# Patient Record
Sex: Female | Born: 1955 | Race: White | Hispanic: No | Marital: Married | Smoking: Never smoker
Health system: Southern US, Community
[De-identification: ages and names within clinical notes are randomized; demographics above are authoritative.]

## PROBLEM LIST (undated history)

## (undated) DIAGNOSIS — E079 Disorder of thyroid, unspecified: Secondary | ICD-10-CM

## (undated) DIAGNOSIS — F329 Major depressive disorder, single episode, unspecified: Secondary | ICD-10-CM

## (undated) DIAGNOSIS — F32A Depression, unspecified: Secondary | ICD-10-CM

## (undated) DIAGNOSIS — F419 Anxiety disorder, unspecified: Secondary | ICD-10-CM

## (undated) DIAGNOSIS — E7800 Pure hypercholesterolemia, unspecified: Secondary | ICD-10-CM

## (undated) DIAGNOSIS — E119 Type 2 diabetes mellitus without complications: Secondary | ICD-10-CM

## (undated) HISTORY — PX: ABDOMINAL HYSTERECTOMY: SHX81

## (undated) HISTORY — PX: OTHER SURGICAL HISTORY: SHX169

## (undated) HISTORY — PX: TUBAL LIGATION: SHX77

---

## 2011-07-22 ENCOUNTER — Emergency Department: Payer: Self-pay | Admitting: *Deleted

## 2011-08-01 ENCOUNTER — Ambulatory Visit: Payer: Self-pay | Admitting: Gastroenterology

## 2011-08-30 ENCOUNTER — Ambulatory Visit: Payer: Self-pay | Admitting: Gastroenterology

## 2012-02-16 ENCOUNTER — Ambulatory Visit: Payer: Self-pay

## 2012-02-16 LAB — URINALYSIS, COMPLETE
Glucose,UR: NEGATIVE mg/dL (ref 0–75)
Ketone: NEGATIVE
Nitrite: POSITIVE
Ph: 7 (ref 4.5–8.0)
Specific Gravity: 1.015 (ref 1.003–1.030)

## 2012-02-18 LAB — URINE CULTURE

## 2012-08-21 ENCOUNTER — Ambulatory Visit: Payer: Self-pay | Admitting: Family Medicine

## 2014-02-10 ENCOUNTER — Ambulatory Visit: Payer: Self-pay | Admitting: Family Medicine

## 2014-02-11 ENCOUNTER — Ambulatory Visit: Payer: Self-pay | Admitting: Family Medicine

## 2014-03-14 ENCOUNTER — Ambulatory Visit: Payer: Self-pay | Admitting: Family Medicine

## 2014-04-14 ENCOUNTER — Ambulatory Visit: Payer: Self-pay | Admitting: Family Medicine

## 2015-02-03 ENCOUNTER — Other Ambulatory Visit: Payer: Self-pay | Admitting: Family Medicine

## 2015-02-03 NOTE — Telephone Encounter (Signed)
Routing to provider  

## 2015-03-10 ENCOUNTER — Other Ambulatory Visit: Payer: Self-pay | Admitting: Family Medicine

## 2015-03-10 NOTE — Telephone Encounter (Signed)
Left message for patient to call.

## 2015-03-10 NOTE — Telephone Encounter (Signed)
Patient notified

## 2015-03-10 NOTE — Telephone Encounter (Signed)
Routing to provider  

## 2015-03-10 NOTE — Telephone Encounter (Signed)
I reviewed my last note and documented that patient's endocrinologist is managing her diabetes She should contact her endocrinologist from now on for these refills; thanks

## 2015-04-26 ENCOUNTER — Other Ambulatory Visit: Payer: Self-pay | Admitting: Family Medicine

## 2015-04-26 DIAGNOSIS — Z1231 Encounter for screening mammogram for malignant neoplasm of breast: Secondary | ICD-10-CM

## 2015-05-04 ENCOUNTER — Ambulatory Visit
Admission: RE | Admit: 2015-05-04 | Discharge: 2015-05-04 | Disposition: A | Discharge status: Home / Self Care | Payer: BLUE CROSS/BLUE SHIELD | Source: Ambulatory Visit | Attending: Family Medicine | Admitting: Family Medicine

## 2015-05-04 DIAGNOSIS — Z1231 Encounter for screening mammogram for malignant neoplasm of breast: Secondary | ICD-10-CM | POA: Diagnosis not present

## 2015-06-08 ENCOUNTER — Telehealth: Payer: Self-pay | Admitting: Family Medicine

## 2015-06-08 NOTE — Telephone Encounter (Signed)
January labs reviewed Please let Elliot GaultPhyllis A Dopson know that I'd like to see patient for an appointment here in the office for:   Please schedule a visit with me in late November Fasting?  Yes please (last fasting labs were January) Thank you, Dr. Sherie DonLada I'm sending refill as requested; not sure why it's in med list twice

## 2015-06-08 NOTE — Telephone Encounter (Signed)
Routing to provider  

## 2015-06-10 NOTE — Telephone Encounter (Signed)
Talked with patient she [place] and will call back Monday to schedule a f/u appointment with fasting labs.

## 2015-06-17 NOTE — Telephone Encounter (Signed)
Left 3 vm on patient phone to call us back and schedule a f/u.

## 2015-06-30 ENCOUNTER — Encounter: Payer: Self-pay | Admitting: Family Medicine

## 2015-08-02 ENCOUNTER — Other Ambulatory Visit: Payer: Self-pay | Admitting: Family Medicine

## 2015-08-02 NOTE — Telephone Encounter (Signed)
Please see October phone note Patient still needs a follow-up appt; I appreciate you sending the letter but she apparently still hasn't responded; do you mind calling again? Thanks

## 2015-08-02 NOTE — Telephone Encounter (Signed)
Routing to provider  

## 2015-08-03 NOTE — Telephone Encounter (Signed)
Left vm on patient chart to call us back.

## 2015-08-13 ENCOUNTER — Encounter: Payer: Self-pay | Admitting: Family Medicine

## 2015-08-28 ENCOUNTER — Ambulatory Visit
Admission: EM | Admit: 2015-08-28 | Discharge: 2015-08-28 | Disposition: A | Discharge status: Home / Self Care | Payer: BLUE CROSS/BLUE SHIELD | Attending: Family Medicine | Admitting: Family Medicine

## 2015-08-28 ENCOUNTER — Encounter: Payer: Self-pay | Admitting: Gynecology

## 2015-08-28 DIAGNOSIS — J40 Bronchitis, not specified as acute or chronic: Secondary | ICD-10-CM

## 2015-08-28 DIAGNOSIS — J0100 Acute maxillary sinusitis, unspecified: Secondary | ICD-10-CM | POA: Diagnosis not present

## 2015-08-28 DIAGNOSIS — J209 Acute bronchitis, unspecified: Secondary | ICD-10-CM

## 2015-08-28 HISTORY — DX: Depression, unspecified: F32.A

## 2015-08-28 HISTORY — DX: Disorder of thyroid, unspecified: E07.9

## 2015-08-28 HISTORY — DX: Type 2 diabetes mellitus without complications: E11.9

## 2015-08-28 HISTORY — DX: Pure hypercholesterolemia, unspecified: E78.00

## 2015-08-28 HISTORY — DX: Major depressive disorder, single episode, unspecified: F32.9

## 2015-08-28 HISTORY — DX: Anxiety disorder, unspecified: F41.9

## 2015-08-28 MED ORDER — ALBUTEROL SULFATE HFA 108 (90 BASE) MCG/ACT IN AERS: 2.0000 | INHALATION_SPRAY | RESPIRATORY_TRACT | Status: DC | PRN

## 2015-08-28 MED ORDER — AZITHROMYCIN 250 MG PO TABS: ORAL_TABLET | ORAL | Status: DC

## 2015-08-28 MED ORDER — BENZONATATE 100 MG PO CAPS: 100.0000 mg | ORAL_CAPSULE | Freq: Three times a day (TID) | ORAL | Status: DC | PRN

## 2015-08-28 MED ORDER — GUAIFENESIN-CODEINE 100-10 MG/5ML PO SOLN
10.0000 mL | Freq: Every evening | ORAL | Status: DC | PRN
Start: 2015-08-28 — End: 2015-10-01

## 2015-08-28 NOTE — ED Notes (Signed)
Patient c/o cough / sinus / ear pain/ wheezing x 5 days.

## 2015-08-28 NOTE — ED Provider Notes (Signed)
[facility]  ____________________________________________  Time seen: Approximately 4:11 PM  I have reviewed the triage vital signs and the nursing notes.   HISTORY  Chief Complaint URI   HPI Elliot Gaulthyllis A Fister is a 60 y.o. female presents with a complaint of 5-6 days of cough and congestion. Patient reports initially started out with sore throat, runny nose and nasal congestion but states has continued and states primarily a cough. Patient states continues to have some sinus drainage and some sinus pressure as she states that she can fill aching pressure around her cheeks. Also reports cough and states cough is throughout the day but worse at night. Patient positioning. The drainage at night. States cough is primarily a dry cough.  States current sinus discomfort is 2 out of 10 aching. Denies chest pain, shortness of breath, dizziness, weakness, fevers. Patient reports that she frequently exposed to sick people at work.  PCP: Sherie DonLada   Past Medical History  Diagnosis Date  . Thyroid disease   . Diabetes mellitus without complication (HCC)   . Anxiety   . Depression   . High cholesterol     There are no active problems to display for this patient.   Past Surgical History  Procedure Laterality Date  . Tubal ligation    . Thyriodectomy    . Abdominal hysterectomy      Current Outpatient Rx  Name  Route  Sig  Dispense  Refill  . aspirin 81 MG tablet   Oral   Take 81 mg by mouth daily.         . Cholecalciferol (VITAMIN D-3) 1000 units CAPS   Oral   Take by mouth.         . finasteride (PROSCAR) 5 MG tablet   Oral   Take 5 mg by mouth daily.         . Flaxseed, Linseed, (FLAXSEED OIL PO)   Oral   Take by mouth.         . Lactobacillus (PROBIOTIC ACIDOPHILUS PO)   Oral   Take by mouth.         . spironolactone (ALDACTONE) 100 MG tablet   Oral   Take 100 mg by mouth daily.         Marland Kitchen. thyroid (ARMOUR) 90 MG tablet   Oral   Take 90 mg by  mouth daily.         .           .           .           .               .           .           .           .             Allergies Fish allergy and Shellfish allergy  No family history on file.  Social History Social History  Substance Use Topics  . Smoking status: Never Smoker   . Smokeless tobacco: None  . Alcohol Use: Yes    Review of Systems Constitutional: No fever/chills Eyes: No visual changes. ENT: No sore throat. Cardiovascular: Denies chest pain. Respiratory: Denies shortness of breath. Gastrointestinal: No abdominal pain.  No nausea, no vomiting.  No diarrhea.  No constipation. Genitourinary: Negative for dysuria. Musculoskeletal: Negative for back pain. Skin: Negative for  rash. Neurological: Negative for headaches, focal weakness or numbness.  10-point ROS otherwise negative.  ____________________________________________   PHYSICAL EXAM:  VITAL SIGNS: ED Triage Vitals  Enc Vitals Group     BP 08/28/15 1430 137/79 mmHg     Pulse Rate 08/28/15 1430 69     Resp 08/28/15 1430 16     Temp 08/28/15 1430 97.7 F (36.5 C)     Temp Source 08/28/15 1430 Oral     SpO2 08/28/15 1430 98 %     Weight 08/28/15 1430 170 lb (77.111 kg)     Height 08/28/15 1430 5\' 5"  (1.651 m)     Head Cir --      Peak Flow --      Pain Score 08/28/15 1433 0     Pain Loc --      Pain Edu? --      Excl. in GC? --     Constitutional: Alert and oriented. Well appearing and in no acute distress. Eyes: Conjunctivae are normal. PERRL. EOMI. Head: Atraumatic. Mild tenderness to palpation bilateral maxillary sinuses. No frontal sinus tenderness to palpation. No swelling. No erythema.  Ears: no erythema, normal TMs bilaterally.   Nose: Nasal congestion with bilateral nasal turbinate erythema.  Mouth/Throat: Mucous membranes are moist.  Oropharynx non-erythematous. No tonsillar swelling or exudate. Neck: No stridor.  No cervical spine tenderness to  palpation. Hematological/Lymphatic/Immunilogical: No cervical lymphadenopathy. Cardiovascular: Normal rate, regular rhythm. Grossly normal heart sounds.  Good peripheral circulation. Respiratory: Normal respiratory effort.  No retractions. Lungs CTAB. No wheezes, rales or rhonchi at rest. Speaks in complete sentences. Patient with dry intermittent cough noted in room, mild wheeze bronchospasm noted with cough. Gastrointestinal: Soft and nontender.  Musculoskeletal: No lower or upper extremity tenderness nor edema.  No calf tenderness bilaterally. Bilateral pedal pulses equal and easily palpated.  Neurologic:  Normal speech and language. No gross focal neurologic deficits are appreciated. No gait instability. Skin:  Skin is warm, dry and intact. No rash noted. Psychiatric: Mood and affect are normal. Speech and behavior are normal.  ____________________________________________   LABS (all labs ordered are listed, but only abnormal results are displayed)  Labs Reviewed - No data to display   INITIAL IMPRESSION / ASSESSMENT AND PLAN / ED COURSE  Pertinent labs & imaging results that were available during my care of the patient were reviewed by me and considered in my medical decision making (see chart for details).  Very well-appearing patient. No acute distress. Presents for the complaints of 5-6 days of runny nose, nasal congestion and cough. Lungs clear throughout except for with slight mild bronchospasm wheeze noted with dry intermittent cough in room. Minimal to mild sinus maxillary tenderness to palpation. Very well-appearing patient. Suspect sinusitis and bronchitis with bronchospasm. No areas of focal consolidation auscultated.  Will treat bronchitis and sinusitis with oral azithromycin, when necessary Tessalon Perles during the day, when necessary guaifenesin with codeine at night and when necessary albuterol inhaler as needed. Encouraged rest, fluids and PCP follow up. Encouraged  patient if chest congestion increases, increased wheezing or fevers recommend reevaluation and likely chest x-ray. Patient reports does not want chest x-ray performed at this time.  Discussed follow up with Primary care physician this week. Discussed follow up and return parameters including no resolution or any worsening concerns. Patient verbalized understanding and agreed to plan.   ____________________________________________   FINAL CLINICAL IMPRESSION(S) / ED DIAGNOSES  Final diagnoses:  Bronchitis with bronchospasm  Acute maxillary sinusitis, recurrence not specified  Renford Dills, NP 08/28/15 [phone]

## 2015-08-28 NOTE — Discharge Instructions (Signed)
Take medication as prescribed. Rest.   Follow up with your primary care physician this week as needed. Return to Urgent care or go to ER for chest pain, shortness of breath, fevers, new or worsening concerns.   Sinusitis, Adult Sinusitis is redness, soreness, and puffiness (inflammation) of the air pockets in the bones of your face (sinuses). The redness, soreness, and puffiness can cause air and mucus to get trapped in your sinuses. This can allow germs to grow and cause an infection.  HOME CARE  1. Drink enough fluids to keep your pee (urine) clear or pale yellow. 2. Use a humidifier in your home. 3. Run a hot shower to create steam in the bathroom. Sit in the bathroom with the door closed. Breathe in the steam 3-4 times a day. 4. Put a warm, moist washcloth on your face 3-4 times a day, or as told by your doctor. 5. Use salt water sprays (saline sprays) to wet the thick fluid in your nose. This can help the sinuses drain. 6. Only take medicine as told by your doctor. GET HELP RIGHT AWAY IF:   Your pain gets worse.  You have very bad headaches.  You are sick to your stomach (nauseous).  You throw up (vomit).  You are very sleepy (drowsy) all the time.  Your face is puffy (swollen).  Your vision changes.  You have a stiff neck.  You have trouble breathing. MAKE SURE YOU:   Understand these instructions.  Will watch your condition.  Will get help right away if you are not doing well or get worse.   This information is not intended to replace advice given to you by your health care provider. Make sure you discuss any questions you have with your health care provider.   Document Released: 01/17/2008 Document Revised: 08/21/2014 Document Reviewed: 03/05/2012 Elsevier Interactive Patient Education 2016 [employer].  How to Use an Inhaler Using your inhaler correctly is very important. Good technique will make sure that the medicine reaches your lungs.  HOW TO USE AN  INHALER: 7. Take the cap off the inhaler. 8. If this is the first time using your inhaler, you need to prime it. Shake the inhaler for 5 seconds. Release four puffs into the air, away from your face. Ask your doctor for help if you have questions. 9. Shake the inhaler for 5 seconds. 10. Turn the inhaler so the bottle is above the mouthpiece. 11. Put your pointer finger on top of the bottle. Your thumb holds the bottom of the inhaler. 12. Open your mouth. 13. Either hold the inhaler away from your mouth (the width of 2 fingers) or place your lips tightly around the mouthpiece. Ask your doctor which way to use your inhaler. 14. Breathe out as much air as possible. 15. Breathe in and push down on the bottle 1 time to release the medicine. You will feel the medicine go in your mouth and throat. 16. Continue to take a deep breath in very slowly. Try to fill your lungs. 17. After you have breathed in completely, hold your breath for 10 seconds. This will help the medicine to settle in your lungs. If you cannot hold your breath for 10 seconds, hold it for as long as you can before you breathe out. 18. Breathe out slowly, through pursed lips. Whistling is an example of pursed lips. 19. If your doctor has told you to take more than 1 puff, wait at least 15-30 seconds between puffs. This will  help you get the best results from your medicine. Do not use the inhaler more than your doctor tells you to. 20. Put the cap back on the inhaler. 21. Follow the directions from your doctor or from the inhaler package about cleaning the inhaler. If you use more than one inhaler, ask your doctor which inhalers to use and what order to use them in. Ask your doctor to help you figure out when you will need to refill your inhaler.  If you use a steroid inhaler, always rinse your mouth with water after your last puff, gargle and spit out the water. Do not swallow the water. GET HELP IF:  The inhaler medicine only partially  helps to stop wheezing or shortness of breath.  You are having trouble using your inhaler.  You have some increase in thick spit (phlegm). GET HELP RIGHT AWAY IF:  The inhaler medicine does not help your wheezing or shortness of breath or you have tightness in your chest.  You have dizziness, headaches, or fast heart rate.  You have chills, fever, or night sweats.  You have a large increase of thick spit, or your thick spit is bloody. MAKE SURE YOU:   Understand these instructions.  Will watch your condition.  Will get help right away if you are not doing well or get worse.   This information is not intended to replace advice given to you by your health care provider. Make sure you discuss any questions you have with your health care provider.   Document Released: 05/09/2008 Document Revised: 05/21/2013 Document Reviewed: 02/27/2013 Elsevier Interactive Patient Education [employer]2016 Elsevier Inc.

## 2015-09-06 ENCOUNTER — Other Ambulatory Visit: Payer: Self-pay | Admitting: Family Medicine

## 2015-09-07 NOTE — Telephone Encounter (Signed)
We have tried to call her and sent two letters; Rx denied; she needs an appt and labs

## 2015-10-01 ENCOUNTER — Encounter: Payer: Self-pay | Admitting: Family Medicine

## 2015-10-01 ENCOUNTER — Ambulatory Visit (INDEPENDENT_AMBULATORY_CARE_PROVIDER_SITE_OTHER): Payer: BLUE CROSS/BLUE SHIELD | Admitting: Family Medicine

## 2015-10-01 VITALS — BP 129/82 | HR 71 | Temp 98.6°F | Ht 64.7 in | Wt 173.0 lb

## 2015-10-01 DIAGNOSIS — E7800 Pure hypercholesterolemia, unspecified: Secondary | ICD-10-CM | POA: Diagnosis not present

## 2015-10-01 DIAGNOSIS — Z23 Encounter for immunization: Secondary | ICD-10-CM

## 2015-10-01 DIAGNOSIS — Z1159 Encounter for screening for other viral diseases: Secondary | ICD-10-CM | POA: Insufficient documentation

## 2015-10-01 DIAGNOSIS — Z114 Encounter for screening for human immunodeficiency virus [HIV]: Secondary | ICD-10-CM | POA: Diagnosis not present

## 2015-10-01 DIAGNOSIS — E890 Postprocedural hypothyroidism: Secondary | ICD-10-CM | POA: Diagnosis not present

## 2015-10-01 DIAGNOSIS — Z5181 Encounter for therapeutic drug level monitoring: Secondary | ICD-10-CM

## 2015-10-01 DIAGNOSIS — E119 Type 2 diabetes mellitus without complications: Secondary | ICD-10-CM

## 2015-10-01 DIAGNOSIS — B351 Tinea unguium: Secondary | ICD-10-CM | POA: Diagnosis not present

## 2015-10-01 MED ORDER — SIMVASTATIN 20 MG PO TABS: 20.0000 mg | ORAL_TABLET | Freq: Every day | ORAL | Status: DC

## 2015-10-01 NOTE — Patient Instructions (Addendum)
You received the flu shot today; it should protect you against the flu virus over the coming months; it will take about two weeks for antibodies to develop; do try to stay away from hospitals, nursing homes, and daycares during peak flu season; taking extra vitamin C daily during flu season may help you avoid getting sick  Try Vicks vapor rub or dilute vinegar on the toenails every day for several months to see if that takes care of your nail condition  Your goal blood pressure is less than 130 mmHg on top (I said 140 during our visit, but it's actually under 130 for you) Try to follow the DASH guidelines (DASH stands for Dietary Approaches to Stop Hypertension) Try to limit the sodium in your diet.  Ideally, consume less than 1.5 grams (less than 1,500mg ) per day. Do not add salt when cooking or at the table.  Check the sodium amount on labels when shopping, and choose items lower in sodium when given a choice. Avoid or limit foods that already contain a lot of sodium. Eat a diet rich in fruits and vegetables and whole grains.

## 2015-10-01 NOTE — Progress Notes (Signed)
BP 129/82 mmHg  Pulse 71  Temp(Src) 98.6 F (37 C)  Ht 5' 4.7" (1.643 m)  Wt 173 lb (78.472 kg)  BMI 29.07 kg/m2  SpO2 98%   Subjective:    Patient ID: Susan Bates, female    DOB: [birth year], 61 y.o.   MRN: [id number]  HPI: Susan Bates is a 60 y.o. female  Chief Complaint  Patient presents with  . i dont remember   She had bronchitis a month ago; cough and trouble breathing; went to urgent care; gave her inhaler, used a couple of times, all better now  She is seeing Dr. Tedd Sias for her thyroid, so far it's fine; still at 90 mg; energy level is... 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 Tedd Sias, endocrinology  Flu shot today  Relevant past medical, surgical, family and social history reviewed and updated as indicated Past Medical History  Diagnosis Date  . Thyroid disease   . Diabetes mellitus without complication (HCC)   . Anxiety   . Depression   . High cholesterol   *post-operative hypothyroidism*  Past Surgical History  Procedure Laterality Date  . Tubal ligation    . Thyriodectomy    . Abdominal hysterectomy     Interim medical history since our last visit reviewed. Allergies and medications reviewed and updated.  Review of Systems No loss of appetite, no abd pain Has possible nail fungus; was supposed to give culture specimen to endo but hasn't done it yet Per HPI unless specifically indicated above     Objective:    BP 129/82 mmHg  Pulse 71  Temp(Src) 98.6 F (37 C)  Ht 5' 4.7" (1.643 m)  Wt 173 lb (78.472 kg)  BMI 29.07 kg/m2  SpO2 98%  Wt Readings from Last 3 Encounters:  10/01/15 173 lb (78.472 kg)  08/28/15 170 lb (77.111 kg)    Physical Exam  Constitutional: She appears well-developed and well-nourished. No distress.  HENT:   Head: Normocephalic and atraumatic.  Eyes: EOM are normal. No scleral icterus.  Neck: No thyromegaly present.  Cardiovascular: Normal rate, regular rhythm and normal heart sounds.   No murmur heard. Pulmonary/Chest: Effort normal and breath sounds normal. No respiratory distress. She has no wheezes.  Abdominal: Soft. Bowel sounds are normal. She exhibits no distension.  Musculoskeletal: Normal range of motion. She exhibits no edema.  Neurological: She is alert. She exhibits normal muscle tone.  Skin: Skin is warm and dry. She is not diaphoretic. No pallor.  Psychiatric: She has a normal mood and affect. Her behavior is normal. Judgment and thought content normal.   Diabetic Foot Form - Detailed   Diabetic Foot Exam - detailed  Diabetic Foot exam was performed with the following findings:  Yes 10/01/2015  2:10 PM  Visual Foot Exam completed.:  Yes  Are the toenails long?:  No  Are the toenails thick?:  No  Are the toenails ingrown?:  No    Pulse Foot Exam completed.:  Yes  Right Dorsalis Pedis:  Present Left Dorsalis Pedis:  Present  Sensory Foot Exam Completed.:  Yes  Swelling:  No  Semmes-Weinstein Monofilament Test  R Site 1-Great Toe:  Pos L Site 1-Great Toe:  Pos  R Site 4:  Pos L Site 4:  Pos  R Site 5:  Pos L Site 5:  Pos    Comments:  Yellowish discoloration and some separation of nailbed great toenails, left worse than right        Assessment & Plan:   Problem List Items Addressed This Visit      Endocrine   DM type 2 (diabetes mellitus, type 2) (HCC)    Managed by endocrinologist; foot exam done by MD today      Relevant Medications   simvastatin (ZOCOR) 20 MG tablet   Other Relevant Orders   Lipid Panel w/o Chol/HDL Ratio (Completed)   Post-surgical hypothyroidism    Managed by endocrinologist        Other   Hypercholesteremia - Primary    Check lipid panel today; goal HDL over 50, goal TG under 150, goal LDL under 100; limit saturated fats;  acknowledged difficulty eating while traveling, encouraged good choices      Relevant Medications   simvastatin (ZOCOR) 20 MG tablet   Medication monitoring encounter    Check sgpt on statin      Relevant Orders   Comprehensive metabolic panel (Completed)   Need for hepatitis C screening test    Offered one-time test for hep c      Relevant Orders   Hepatitis C antibody (Completed)   Screening for HIV (human immunodeficiency virus)    Offered one-time test for hiv      Relevant Orders   HIV antibody (Completed)    Other Visit Diagnoses    Immunization due        Relevant Orders    Flu Vaccine QUAD 36+ mos PF IM (Fluarix & Fluzone Quad PF) (Completed)    Onychomycosis        suggested vicks vapor rub or dilute vinegar solution; separate nail clippers for healthy vs diseased nails       Follow up plan: Return in about 6 months (around 03/30/2016) for thirty minute follow-up with fasting labs; physical when due.  Orders Placed This Encounter  Procedures  . Flu Vaccine QUAD 36+ mos PF IM (Fluarix & Fluzone Quad PF)  . Lipid Panel w/o Chol/HDL Ratio  . Comprehensive metabolic panel  . Hepatitis C antibody  . HIV antibody   Meds ordered this encounter  Medications  . Multiple Vitamin (MULTI-VITAMINS) TABS    Sig: Take by mouth daily.  . simvastatin (ZOCOR) 20 MG tablet    Sig: Take 1 tablet (20 mg total) by mouth at bedtime.    Dispense:  90 tablet    Refill:  3   An after-visit summary was printed and given to the patient at check-out.  Please see the patient instructions which may contain other information and recommendations beyond what is mentioned above in the assessment and plan.

## 2015-10-02 ENCOUNTER — Encounter: Payer: Self-pay | Admitting: Family Medicine

## 2015-10-02 LAB — COMPREHENSIVE METABOLIC PANEL
ALK PHOS: 63 IU/L (ref 39–117)
ALT: 10 IU/L (ref 0–32)
AST: 16 IU/L (ref 0–40)
Albumin/Globulin Ratio: 1.8 (ref 1.1–2.5)
Albumin: 4.6 g/dL (ref 3.5–5.5)
BUN/Creatinine Ratio: 14 (ref 9–23)
BUN: 10 mg/dL (ref 6–24)
Bilirubin Total: <0.2 mg/dL (ref 0.0–1.2)
CALCIUM: 9.4 mg/dL (ref 8.7–10.2)
CHLORIDE: 98 mmol/L (ref 96–106)
CO2: 24 mmol/L (ref 18–29)
CREATININE: 0.7 mg/dL (ref 0.57–1.00)
GFR calc Af Amer: 110 mL/min/{1.73_m2} (ref >59)
GFR, EST NON AFRICAN AMERICAN: 96 mL/min/{1.73_m2} (ref >59)
GLUCOSE: 95 mg/dL (ref 65–99)
Globulin, Total: 2.5 g/dL (ref 1.5–4.5)
POTASSIUM: 4.6 mmol/L (ref 3.5–5.2)
SODIUM: 140 mmol/L (ref 134–144)
Total Protein: 7.1 g/dL (ref 6.0–8.5)

## 2015-10-02 LAB — HIV ANTIBODY (ROUTINE TESTING W REFLEX): HIV SCREEN 4TH GENERATION: NONREACTIVE

## 2015-10-02 LAB — LIPID PANEL W/O CHOL/HDL RATIO
Cholesterol, Total: 194 mg/dL (ref 100–199)
HDL: 33 mg/dL — AB (ref >39)
LDL Calculated: 100 mg/dL — ABNORMAL HIGH (ref 0–99)
Triglycerides: 305 mg/dL — ABNORMAL HIGH (ref 0–149)
VLDL CHOLESTEROL CAL: 61 mg/dL — AB (ref 5–40)

## 2015-10-02 LAB — HEPATITIS C ANTIBODY: Hep C Virus Ab: <0.1 s/co ratio (ref 0.0–0.9)

## 2015-10-02 NOTE — Assessment & Plan Note (Signed)
Offered one-time test for hiv

## 2015-10-02 NOTE — Assessment & Plan Note (Signed)
Check sgpt on statin 

## 2015-10-02 NOTE — Assessment & Plan Note (Signed)
Managed by endocrinologist; foot exam done by MD today

## 2015-10-02 NOTE — Assessment & Plan Note (Signed)
Check lipid panel today; goal HDL over 50, goal TG under 150, goal LDL under 100; limit saturated fats; acknowledged difficulty eating while traveling, encouraged good choices

## 2015-10-02 NOTE — Assessment & Plan Note (Signed)
Offered one-time test for hep c

## 2015-10-02 NOTE — Assessment & Plan Note (Signed)
Managed by endocrinologist

## 2016-01-05 ENCOUNTER — Other Ambulatory Visit: Payer: Self-pay | Admitting: Family Medicine

## 2016-03-08 ENCOUNTER — Other Ambulatory Visit: Payer: Self-pay | Admitting: Family Medicine

## 2016-03-09 NOTE — Telephone Encounter (Signed)
I see that Dr. Dossie Arbour took care of her last refill and she has no upcoming appts with me here at [facility] Please confirm with patient if she is staying as a patient at [facility] or coming to [facility] Please forward refill request to appropriate provider Thank you

## 2016-03-10 NOTE — Telephone Encounter (Signed)
Pt scheduled for next week.

## 2016-03-10 NOTE — Telephone Encounter (Signed)
Thank you. Rx sent.

## 2016-03-14 ENCOUNTER — Encounter: Payer: Self-pay | Admitting: Family Medicine

## 2016-03-14 ENCOUNTER — Ambulatory Visit (INDEPENDENT_AMBULATORY_CARE_PROVIDER_SITE_OTHER): Payer: BLUE CROSS/BLUE SHIELD | Admitting: Family Medicine

## 2016-03-14 VITALS — BP 122/74 | HR 84 | Temp 99.0°F | Resp 14 | Wt 171.0 lb

## 2016-03-14 DIAGNOSIS — E119 Type 2 diabetes mellitus without complications: Secondary | ICD-10-CM | POA: Diagnosis not present

## 2016-03-14 DIAGNOSIS — Z5181 Encounter for therapeutic drug level monitoring: Secondary | ICD-10-CM

## 2016-03-14 DIAGNOSIS — E890 Postprocedural hypothyroidism: Secondary | ICD-10-CM

## 2016-03-14 DIAGNOSIS — E7800 Pure hypercholesterolemia, unspecified: Secondary | ICD-10-CM

## 2016-03-14 MED ORDER — SIMVASTATIN 20 MG PO TABS: 20.0000 mg | ORAL_TABLET | Freq: Every day | ORAL | 3 refills | Status: DC

## 2016-03-14 MED ORDER — FLUOXETINE HCL 20 MG PO TABS: 20.0000 mg | ORAL_TABLET | Freq: Every day | ORAL | 3 refills | Status: DC

## 2016-03-14 NOTE — Progress Notes (Signed)
BP 122/74   Pulse 84   Temp 99 F (37.2 C) (Oral)   Resp 14   Wt 171 lb (77.6 kg)   SpO2 97%   BMI 28.72 kg/m    Subjective:    Patient ID: Susan Bates, female    DOB: 11/08/1955, 60 y.o.   MRN: [id number]  HPI: Susan Bates is a 60 y.o. female  Chief Complaint  Patient presents with  . 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 Takes 81 mg aspirin daily Works for [employer]; there are plenty of issues with uniforms; no shortness of breath or trouble breathing; was having headaches though, so she quit wearing the new uniform and went back to the old ones; [employer] is the company  She lost a few pounds since last visit; this is late afternoon after lunch so not what she might weigh first thing in the morning  High cholesterol; trying to limit saturated fats; not a milk drinker; might have almond milk;  Lab Results  Component Value Date   CHOL 194 10/01/2015   Lab Results  Component Value Date   HDL 33 (L) 10/01/2015   Lab Results  Component Value Date   LDLCALC 100 (H) 10/01/2015   Lab Results  Component Value Date   TRIG 305 (H) 10/01/2015   No results found for: CHOLHDL No results found for: LDLDIRECT   Thyroid disease; sees specialist  Diabetes; sees specialist; thinks it has not been 6 months yet so she'll be due for A1c in Sept; she'll start writing it down; today's FSBS 118; for a while it was 110-118 fasting, occasionally lower, then for 3 weeks it was 140; diet was not different; had some headaches, some stress  Taking fluoxetine; sometimes flat or tearful; no self-harm; no hallucination; she has had it higher before and not notice any help; not sure if helpful; has thought about stopping it; sometimes forgets it for a day  Depression screen PHQ 2/9 03/14/2016  Decreased Interest  0  Down, Depressed, Hopeless 0  PHQ - 2 Score 0   Relevant past medical, surgical, family and social history reviewed Past Medical History:  Diagnosis Date  . Anxiety   . Depression   . Diabetes mellitus without complication (HCC)   . High cholesterol   . Thyroid disease    Past Surgical History:  Procedure Laterality Date  . ABDOMINAL HYSTERECTOMY    . thyriodectomy    . TUBAL LIGATION     History reviewed. No pertinent family history. Social History  Substance Use Topics  . Smoking status: Never Smoker  . Smokeless tobacco: Never Used  . Alcohol use Yes   Interim medical history since last visit reviewed. Allergies and medications reviewed  Review of Systems Per HPI unless specifically indicated above     Objective:    BP 122/74   Pulse 84   Temp 99 F (37.2 C) (Oral)   Resp 14   Wt 171 lb (77.6 kg)   SpO2 97%   BMI 28.72 kg/m   Wt Readings from Last 3 Encounters:  03/14/16 171 lb (77.6 kg)  10/01/15 173 lb (78.5 kg)  08/28/15 170 lb (77.1 kg)    Physical Exam  Constitutional: She appears well-developed and well-nourished. No distress.  overweight  HENT:  Head: Normocephalic and atraumatic.  Eyes: EOM are normal. No scleral icterus.  Neck: No thyromegaly present.  Cardiovascular: Normal rate, regular rhythm and normal heart sounds.   No murmur heard. Pulmonary/Chest: Effort normal and breath sounds normal. No respiratory distress. She has no wheezes.  Abdominal: Soft. Bowel sounds are normal. She exhibits no distension.  Musculoskeletal: Normal range of motion. She exhibits no edema.  Neurological: She is alert. She exhibits normal muscle tone.  Skin: Skin is warm and dry. She is not diaphoretic. No pallor.  Psychiatric: She has a normal mood and affect. Her behavior is normal. Judgment and thought content normal.   Diabetic Foot Form - Detailed   Diabetic Foot Exam - detailed Diabetic Foot exam was performed with the following findings:  Yes  03/15/2016 12:10 PM  Visual Foot Exam completed.:  Yes  Are the toenails ingrown?:  No Normal Range of Motion:  Yes Pulse Foot Exam completed.:  Yes  Right Dorsalis Pedis:  Present Left Dorsalis Pedis:  Present  Sensory Foot Exam Completed.:  Yes Semmes-Weinstein Monofilament Test R Site 1-Great Toe:  Pos L Site 1-Great Toe:  Pos  R Site 4:  Pos L Site 4:  Pos  R Site 5:  Pos L Site 5:  Pos         Results for orders placed or performed in visit on 10/01/15  Lipid Panel w/o Chol/HDL Ratio  Result Value Ref Range   Cholesterol, Total 194 100 - 199 mg/dL   Triglycerides 161 (H) 0 - 149 mg/dL   HDL 33 (L) >09 mg/dL   VLDL Cholesterol Cal 61 (H) 5 - 40 mg/dL   LDL Calculated 604 (H) 0 - 99 mg/dL  Comprehensive metabolic panel  Result Value Ref Range   Glucose 95 65 - 99 mg/dL   BUN 10 6 - 24 mg/dL   Creatinine, Ser 5.40 0.57 - 1.00 mg/dL   GFR calc non Af Amer 96 >59 mL/min/1.73   GFR calc Af Amer 110 >59 mL/min/1.73   BUN/Creatinine Ratio 14 9 - 23   Sodium 140 134 - 144 mmol/L   Potassium 4.6 3.5 - 5.2 mmol/L   Chloride 98 96 - 106 mmol/L   CO2 24 18 - 29 mmol/L   Calcium 9.4 8.7 - 10.2 mg/dL   Total Protein 7.1 6.0 - 8.5 g/dL   Albumin 4.6 3.5 - 5.5 g/dL   Globulin, Total 2.5 1.5 - 4.5 g/dL   Albumin/Globulin Ratio 1.8 1.1 - 2.5   Bilirubin Total <0.2 0.0 - 1.2 mg/dL   Alkaline Phosphatase 63 39 - 117 IU/L   AST 16 0 - 40 IU/L   ALT 10 0 - 32 IU/L  Hepatitis C antibody  Result Value Ref Range   Hep C Virus Ab <0.1 0.0 - 0.9 s/co ratio  HIV antibody  Result Value Ref Range   HIV Screen 4th Generation wRfx Non Reactive Non Reactive      Assessment & Plan:   Problem List Items Addressed This Visit      Endocrine   Post-surgical hypothyroidism    Check TSH      Relevant Orders   TSH   DM type 2 (diabetes mellitus, type 2) (HCC)    Foot exam by MD; check A1c and urine microalbumin in September      Relevant Medications   simvastatin (ZOCOR) 20 MG tablet    Other Relevant Orders   Hemoglobin A1c   Microalbumin / creatinine urine ratio     Other  Medication monitoring encounter    Check Cr and SGPT      Relevant Orders   Comprehensive Metabolic Panel (CMET)   CBC with Differential/Platelet   Hypercholesteremia    Limit saturated fats; continue statin      Relevant Medications   simvastatin (ZOCOR) 20 MG tablet   Other Relevant Orders   Lipid panel    Other Visit Diagnoses   None.      Follow up plan: Return in about 6 months (around 09/14/2016) for visit.  An after-visit summary was printed and given to the patient at check-out.  Please see the patient instructions which may contain other information and recommendations beyond what is mentioned above in the assessment and plan.  Meds ordered this encounter  Medications  . simvastatin (ZOCOR) 20 MG tablet    Sig: Take 1 tablet (20 mg total) by mouth at bedtime.    Dispense:  90 tablet    Refill:  3  . FLUoxetine (PROZAC) 20 MG tablet    Sig: Take 1 tablet (20 mg total) by mouth daily.    Dispense:  90 tablet    Refill:  3    Orders Placed This Encounter  Procedures  . Lipid panel  . Comprehensive Metabolic Panel (CMET)  . CBC with Differential/Platelet  . Hemoglobin A1c  . Microalbumin / creatinine urine ratio  . TSH

## 2016-03-14 NOTE — Patient Instructions (Addendum)
Please do see your eye doctor regularly, and have your eyes examined every year (or more often per his or her recommendation) Check your feet every night and let me know right away of any sores, infections, numbness, etc. Try to limit sweets, white bread, white rice, white potatoes It is okay with me for you to not check your fingerstick blood sugars (per [employer] of Endocrinology Best Practices), unless you are interested and feel it would be helpful for you  Try to limit saturated fats in your diet (bologna, hot dogs, barbeque, cheeseburgers, hamburgers, steak, bacon, sausage, cheese, etc.) and get more fresh fruits, vegetables, and whole grains  Do have fasting labs done soon when due  Return in 6 months or as needed  Duofilm can be applied to the small area on your finger; follow the instructions, continue until completely gone  Diabetes and Standards of Medical Care Diabetes is complicated. You may find that your diabetes team includes a dietitian, nurse, diabetes educator, eye doctor, and more. To help everyone know what is going on and to help you get the care you deserve, the following schedule of care was developed to help keep you on track. Below are the tests, exams, vaccines, medicines, education, and plans you will need. HbA1c test This test shows how well you have controlled your glucose over the past 2-3 months. It is used to see if your diabetes management plan needs to be adjusted.   It is performed at least 2 times a year if you are meeting treatment goals.  It is performed 4 times a year if therapy has changed or if you are not meeting treatment goals. Blood pressure test  This test is performed at every routine medical visit. The goal is less than 140/90 mm Hg for most people, but 130/80 mm Hg in some cases. Ask your health care provider about your goal. Dental exam  Follow up with the dentist regularly. Eye exam  If you are diagnosed with type 1 diabetes as a  child, get an exam upon reaching the age of 14 years or older and having had diabetes for 3-5 years. Yearly eye exams are recommended after that initial eye exam.  If you are diagnosed with type 1 diabetes as an adult, get an exam within 5 years of diagnosis and then yearly.  If you are diagnosed with type 2 diabetes, get an exam as soon as possible after the diagnosis and then yearly. Foot care exam  Visual foot exams are performed at every routine medical visit. The exams check for cuts, injuries, or other problems with the feet.  You should have a complete foot exam performed every year. This exam includes an inspection of the structure and skin of your feet, a check of the pulses in your feet, and a check of the sensation in your feet.  Type 1 diabetes: The first exam is performed 5 years after diagnosis.  Type 2 diabetes: The first exam is performed at the time of diagnosis.  Check your feet nightly for cuts, injuries, or other problems with your feet. Tell your health care provider if anything is not healing. Kidney function test (urine microalbumin)  This test is performed once a year.  Type 1 diabetes: The first test is performed 5 years after diagnosis.  Type 2 diabetes: The first test is performed at the time of diagnosis.  A serum creatinine and estimated glomerular filtration rate (eGFR) test is done once a year to assess the level  of chronic kidney disease (CKD), if present. Lipid profile (cholesterol, HDL, LDL, triglycerides)  Performed every 5 years for most people.  The goal for LDL is less than 100 mg/dL. If you are at high risk, the goal is less than 70 mg/dL.  The goal for HDL is 40 mg/dL-50 mg/dL for men and 50 mg/dL-60 mg/dL for women. An HDL cholesterol of 60 mg/dL or higher gives some protection against heart disease.  The goal for triglycerides is less than 150 mg/dL. Immunizations  The flu (influenza) vaccine is recommended yearly for every person 6 months  of age or older who has diabetes.  The pneumonia (pneumococcal) vaccine is recommended for every person 58 years of age or older who has diabetes. Adults 47 years of age or older may receive the pneumonia vaccine as a series of two separate shots.  The hepatitis B vaccine is recommended for adults shortly after they have been diagnosed with diabetes.  The Tdap (tetanus, diphtheria, and pertussis) vaccine should be given:  According to normal childhood vaccination schedules, for children.  Every 10 years, for adults who have diabetes. Diabetes self-management education  Education is recommended at diagnosis and ongoing as needed. Treatment plan  Your treatment plan is reviewed at every medical visit.   This information is not intended to replace advice given to you by your health care provider. Make sure you discuss any questions you have with your health care provider.   Document Released: 05/28/2009 Document Revised: 08/21/2014 Document Reviewed: 12/31/2012 Elsevier Interactive Patient Education [employer].

## 2016-03-14 NOTE — Assessment & Plan Note (Signed)
Limit saturated fats; continue statin 

## 2016-03-14 NOTE — Assessment & Plan Note (Signed)
Foot exam by MD; check A1c and urine microalbumin in September

## 2016-03-14 NOTE — Assessment & Plan Note (Signed)
Check Cr and SGPT 

## 2016-03-14 NOTE — Assessment & Plan Note (Signed)
Check TSH 

## 2016-05-17 ENCOUNTER — Other Ambulatory Visit: Payer: Self-pay | Admitting: Family Medicine

## 2016-05-17 DIAGNOSIS — Z1231 Encounter for screening mammogram for malignant neoplasm of breast: Secondary | ICD-10-CM

## 2016-05-18 ENCOUNTER — Other Ambulatory Visit: Payer: Self-pay | Admitting: Family Medicine

## 2016-05-18 ENCOUNTER — Ambulatory Visit
Admission: RE | Admit: 2016-05-18 | Discharge: 2016-05-18 | Disposition: A | Discharge status: Home / Self Care | Payer: BLUE CROSS/BLUE SHIELD | Source: Ambulatory Visit | Attending: Family Medicine | Admitting: Family Medicine

## 2016-05-18 DIAGNOSIS — Z1231 Encounter for screening mammogram for malignant neoplasm of breast: Secondary | ICD-10-CM | POA: Diagnosis not present

## 2016-05-19 ENCOUNTER — Other Ambulatory Visit: Payer: Self-pay | Admitting: Family Medicine

## 2016-05-19 DIAGNOSIS — N6320 Unspecified lump in the left breast, unspecified quadrant: Secondary | ICD-10-CM

## 2016-05-29 ENCOUNTER — Ambulatory Visit
Admission: EM | Admit: 2016-05-29 | Discharge: 2016-05-29 | Disposition: A | Discharge status: Home / Self Care | Payer: BLUE CROSS/BLUE SHIELD | Attending: Family Medicine | Admitting: Family Medicine

## 2016-05-29 DIAGNOSIS — N390 Urinary tract infection, site not specified: Secondary | ICD-10-CM | POA: Diagnosis not present

## 2016-05-29 LAB — URINALYSIS COMPLETE WITH MICROSCOPIC (ARMC ONLY)
BILIRUBIN URINE: NEGATIVE
Glucose, UA: NEGATIVE mg/dL
KETONES UR: NEGATIVE mg/dL
Nitrite: NEGATIVE
PH: 5.5 (ref 5.0–8.0)
PROTEIN: NEGATIVE mg/dL
SPECIFIC GRAVITY, URINE: 1.01 (ref 1.005–1.030)

## 2016-05-29 MED ORDER — PHENAZOPYRIDINE HCL 200 MG PO TABS: 200.0000 mg | ORAL_TABLET | Freq: Three times a day (TID) | ORAL | 0 refills | Status: DC | PRN

## 2016-05-29 MED ORDER — NITROFURANTOIN MONOHYD MACRO 100 MG PO CAPS: 100.0000 mg | ORAL_CAPSULE | Freq: Two times a day (BID) | ORAL | 0 refills | Status: DC

## 2016-05-29 MED ORDER — FLUCONAZOLE 150 MG PO TABS: 150.0000 mg | ORAL_TABLET | Freq: Once | ORAL | 0 refills | Status: AC

## 2016-05-29 NOTE — ED Provider Notes (Signed)
[facility]    CSN: [id number] Arrival date & time: 05/29/16  1053     History   Chief Complaint Chief Complaint  Patient presents with  . Urinary Tract Infection    HPI Susan Bates is a 60 y.o. female.   Patient symptoms 5 days history of burning with urination she reports urgency but really no bad frequency. Last time she had a UTI she had frequency. She reports some mild right-sided flank pain but no fever. She reports everything started last Thursday. She does not get frequent UTIs. She is diabetic. She is allergic to shellfish. No pertinent family medical history pertaining to today's visit. She does not smoke.   The history is provided by the patient. No language interpreter was used.  Urinary Tract Infection  Pain quality:  Burning Pain severity:  Moderate Onset quality:  Sudden Duration:  5 days Timing:  Constant Progression:  Worsening Chronicity:  New Relieved by:  Nothing Worsened by:  Nothing Ineffective treatments:  None tried Associated symptoms: flank pain   Associated symptoms: no abdominal pain, no fever, no genital lesions, no vaginal discharge and no vomiting   Risk factors: no hx of pyelonephritis, no hx of urolithiasis, no kidney transplant, not pregnant, no recurrent urinary tract infections, no single kidney and no urinary catheter   Dysuria  Pain quality:  Burning Pain severity:  Moderate Duration:  5 days Chronicity:  New Recent urinary tract infections: no   Associated symptoms: flank pain   Associated symptoms: no abdominal pain, no fever, no genital lesions, no vaginal discharge and no vomiting     Past Medical History:  Diagnosis Date  . Anxiety   . Depression   . Diabetes mellitus without complication (HCC)   . High cholesterol   . Thyroid disease     Patient Active Problem List   Diagnosis Date Noted  . DM type 2 (diabetes mellitus, type 2) (HCC) 10/01/2015  . Post-surgical hypothyroidism 10/01/2015  .  Hypercholesteremia 10/01/2015  . Medication monitoring encounter 10/01/2015  . Need for hepatitis C screening test 10/01/2015  . Screening for HIV (human immunodeficiency virus) 10/01/2015    Past Surgical History:  Procedure Laterality Date  . ABDOMINAL HYSTERECTOMY    . thyriodectomy    . TUBAL LIGATION      OB History    No data available       Home Medications    Prior to Admission medications   Medication Sig Start Date End Date Taking? Authorizing Provider  aspirin 81 MG tablet Take 81 mg by mouth daily.    Historical Provider, MD  Cholecalciferol (VITAMIN D-3) 1000 units CAPS Take by mouth.    Historical Provider, MD  finasteride (PROSCAR) 5 MG tablet Take 5 mg by mouth daily.    Historical Provider, MD  Flaxseed, Linseed, (FLAXSEED OIL PO) Take by mouth.    Historical Provider, MD  fluconazole (DIFLUCAN) 150 MG tablet Take 1 tablet (150 mg total) by mouth once. 05/29/16 05/29/16  Hassan RowanEugene Minetta Krisher, MD  FLUoxetine (PROZAC) 20 MG tablet Take 1 tablet (20 mg total) by mouth daily. 03/14/16   Kerman PasseyMelinda P Lada, MD  metFORMIN (GLUCOPHAGE) 500 MG tablet Take 1,000 mg by mouth 2 (two) times daily with a meal.    Historical Provider, MD  Multiple Vitamin (MULTI-VITAMINS) TABS Take by mouth daily.    Historical Provider, MD  nitrofurantoin, macrocrystal-monohydrate, (MACROBID) 100 MG capsule Take 1 capsule (100 mg total) by mouth 2 (two) times daily. 05/29/16  Hassan Rowan, MD  ONE TOUCH ULTRA TEST test strip CHECK FINGER STICK BLOOD SUGAR ONCE DAILY 01/05/16   Steele Sizer, MD  [facility] DELICA LANCETS FINE MISC USE 1 DAILY 01/05/16   Steele Sizer, MD  phenazopyridine (PYRIDIUM) 200 MG tablet Take 1 tablet (200 mg total) by mouth 3 (three) times daily as needed for pain. 05/29/16   Hassan Rowan, MD  simvastatin (ZOCOR) 20 MG tablet Take 1 tablet (20 mg total) by mouth at bedtime. 03/14/16   Kerman Passey, MD  thyroid (ARMOUR) 90 MG tablet Take 90 mg by mouth daily.    Historical Provider,  MD    Family History Family History  Problem Relation Age of Onset  . Breast cancer Neg Hx     Social History Social History  Substance Use Topics  . Smoking status: Never Smoker  . Smokeless tobacco: Never Used  . Alcohol use Yes     Allergies   Fish allergy and Shellfish allergy   Review of Systems Review of Systems  Constitutional: Negative for fever.  Gastrointestinal: Negative for abdominal pain and vomiting.  Genitourinary: Positive for dysuria and flank pain. Negative for vaginal discharge.  All other systems reviewed and are negative.    Physical Exam Triage Vital Signs ED Triage Vitals  Enc Vitals Group     BP 05/29/16 1312 123/81     Pulse Rate 05/29/16 1312 62     Resp 05/29/16 1312 16     Temp 05/29/16 1312 98 F (36.7 C)     Temp Source 05/29/16 1312 Oral     SpO2 05/29/16 1312 100 %     Weight 05/29/16 1314 165 lb (74.8 kg)     Height 05/29/16 1314 5\' 5"  (1.651 m)     Head Circumference --      Peak Flow --      Pain Score 05/29/16 1316 3     Pain Loc --      Pain Edu? --      Excl. in GC? --    No data found.   Updated Vital Signs BP 123/81 (BP Location: Left Arm)   Pulse 62   Temp 98 F (36.7 C) (Oral)   Resp 16   Ht 5\' 5"  (1.651 m)   Wt 165 lb (74.8 kg)   SpO2 100%   BMI 27.46 kg/m   Visual Acuity Right Eye Distance:   Left Eye Distance:   Bilateral Distance:    Right Eye Near:   Left Eye Near:    Bilateral Near:     Physical Exam  Constitutional: She appears well-developed and well-nourished.  HENT:  Head: Normocephalic and atraumatic.  Right Ear: External ear normal.  Left Ear: External ear normal.  Eyes: Pupils are equal, round, and reactive to light.  Pulmonary/Chest: Breath sounds normal.  Abdominal: Soft. Bowel sounds are normal. There is no hepatosplenomegaly. There is no tenderness. There is CVA tenderness. There is no rigidity, no rebound, no guarding and negative Murphy's sign.  Vitals reviewed.    UC  Treatments / Results  Labs (all labs ordered are listed, but only abnormal results are displayed) Labs Reviewed  URINALYSIS COMPLETEWITH MICROSCOPIC ([facility] ONLY) - Abnormal; Notable for the following:       Result Value   Hgb urine dipstick TRACE (*)    Leukocytes, UA TRACE (*)    Bacteria, UA MANY (*)    Squamous Epithelial / LPF 0-5 (*)    All other components within  normal limits  URINE CULTURE    EKG  EKG Interpretation None       Radiology No results found.  Procedures Procedures (including critical care time)  Medications Ordered in UC Medications - No data to display   Initial Impression / Assessment and Plan / UC Course  I have reviewed the triage vital signs and the nursing notes.  Pertinent labs & imaging results that were available during my care of the patient were reviewed by me and considered in my medical decision making (see chart for details). Results for orders placed or performed during the hospital encounter of 05/29/16  Urinalysis complete, with microscopic  Result Value Ref Range   Color, Urine YELLOW YELLOW   APPearance CLEAR CLEAR   Glucose, UA NEGATIVE NEGATIVE mg/dL   Bilirubin Urine NEGATIVE NEGATIVE   Ketones, ur NEGATIVE NEGATIVE mg/dL   Specific Gravity, Urine 1.010 1.005 - 1.030   Hgb urine dipstick TRACE (A) NEGATIVE   pH 5.5 5.0 - 8.0   Protein, ur NEGATIVE NEGATIVE mg/dL   Nitrite NEGATIVE NEGATIVE   Leukocytes, UA TRACE (A) NEGATIVE   RBC / HPF 0-5 0 - 5 RBC/hpf   WBC, UA 6-30 0 - 5 WBC/hpf   Bacteria, UA MANY (A) NONE SEEN   Squamous Epithelial / LPF 0-5 (A) NONE SEEN   Clinical Course    Patient will be placed on Pyridium Diflucan to use if needed since she is a diabetic and will place on Macrobid 100 mg 1 capsule twice a day for 7 days since her pH is only 6. Follow-up with PCP if needed in 1-2 weeks. She declined work note.  Marland Kitchent Final Clinical Impressions(s) / UC Diagnoses   Final diagnoses:  Urinary tract infection  without hematuria, site unspecified    New Prescriptions New Prescriptions   FLUCONAZOLE (DIFLUCAN) 150 MG TABLET    Take 1 tablet (150 mg total) by mouth once.   NITROFURANTOIN, MACROCRYSTAL-MONOHYDRATE, (MACROBID) 100 MG CAPSULE    Take 1 capsule (100 mg total) by mouth 2 (two) times daily.   PHENAZOPYRIDINE (PYRIDIUM) 200 MG TABLET    Take 1 tablet (200 mg total) by mouth 3 (three) times daily as needed for pain.   Note: This dictation was prepared with Dragon dictation along with smaller phrase technology. Any transcriptional errors that result from this process are unintentional..lm   Hassan Rowan, MD 05/29/16 1340

## 2016-05-29 NOTE — ED Triage Notes (Signed)
Patient c/o burning, pressure, back pain, with urination.

## 2016-05-30 ENCOUNTER — Ambulatory Visit
Admission: RE | Admit: 2016-05-30 | Discharge: 2016-05-30 | Disposition: A | Discharge status: Home / Self Care | Payer: BLUE CROSS/BLUE SHIELD | Source: Ambulatory Visit | Attending: Family Medicine | Admitting: Family Medicine

## 2016-05-30 DIAGNOSIS — N6320 Unspecified lump in the left breast, unspecified quadrant: Secondary | ICD-10-CM

## 2016-05-31 LAB — URINE CULTURE
Culture: >=100000 — AB
Special Requests: NORMAL

## 2016-06-01 ENCOUNTER — Telehealth: Payer: Self-pay | Admitting: Emergency Medicine

## 2016-06-01 NOTE — Telephone Encounter (Signed)
Patient notified of her urine culture result.  Patient is taking Macrobid.  Patient states that she is feeling much better. Patient instructed to continue with her Macrobid and to follow-up here or with her PCP if her symptoms worsen.  Patient verbalized understanding.

## 2016-09-07 LAB — CBC WITH DIFFERENTIAL/PLATELET
BASOS ABS: 0.1 10*3/uL (ref 0.0–0.2)
BASOS: 1 %
EOS (ABSOLUTE): 0.2 10*3/uL (ref 0.0–0.4)
Eos: 2 %
Hematocrit: 44.1 % (ref 34.0–46.6)
Hemoglobin: 14.7 g/dL (ref 11.1–15.9)
IMMATURE GRANS (ABS): 0.1 10*3/uL (ref 0.0–0.1)
IMMATURE GRANULOCYTES: 1 %
LYMPHS: 29 %
Lymphocytes Absolute: 2.6 10*3/uL (ref 0.7–3.1)
MCH: 29.8 pg (ref 26.6–33.0)
MCHC: 33.3 g/dL (ref 31.5–35.7)
MCV: 89 fL (ref 79–97)
Monocytes Absolute: 0.4 10*3/uL (ref 0.1–0.9)
Monocytes: 5 %
NEUTROS PCT: 62 %
Neutrophils Absolute: 5.4 10*3/uL (ref 1.4–7.0)
PLATELETS: 394 10*3/uL — AB (ref 150–379)
RBC: 4.94 x10E6/uL (ref 3.77–5.28)
RDW: 14 % (ref 12.3–15.4)
WBC: 8.7 10*3/uL (ref 3.4–10.8)

## 2016-09-07 LAB — COMPREHENSIVE METABOLIC PANEL
A/G RATIO: 1.6 (ref 1.2–2.2)
ALT: 13 IU/L (ref 0–32)
AST: 14 IU/L (ref 0–40)
Albumin: 4.5 g/dL (ref 3.5–5.5)
Alkaline Phosphatase: 56 IU/L (ref 39–117)
BUN/Creatinine Ratio: 19 (ref 9–23)
BUN: 13 mg/dL (ref 6–24)
Bilirubin Total: 0.3 mg/dL (ref 0.0–1.2)
CALCIUM: 9.5 mg/dL (ref 8.7–10.2)
CO2: 24 mmol/L (ref 18–29)
Chloride: 98 mmol/L (ref 96–106)
Creatinine, Ser: 0.69 mg/dL (ref 0.57–1.00)
GFR calc Af Amer: 111 mL/min/{1.73_m2} (ref >59)
GFR, EST NON AFRICAN AMERICAN: 96 mL/min/{1.73_m2} (ref >59)
GLUCOSE: 90 mg/dL (ref 65–99)
Globulin, Total: 2.8 g/dL (ref 1.5–4.5)
POTASSIUM: 4.9 mmol/L (ref 3.5–5.2)
Sodium: 140 mmol/L (ref 134–144)
Total Protein: 7.3 g/dL (ref 6.0–8.5)

## 2016-09-07 LAB — HEMOGLOBIN A1C
Est. average glucose Bld gHb Est-mCnc: 117 mg/dL
HEMOGLOBIN A1C: 5.7 % — AB (ref 4.8–5.6)

## 2016-09-07 LAB — LIPID PANEL
CHOL/HDL RATIO: 5.2 ratio — AB (ref 0.0–4.4)
Cholesterol, Total: 191 mg/dL (ref 100–199)
HDL: 37 mg/dL — ABNORMAL LOW (ref >39)
LDL CALC: 115 mg/dL — AB (ref 0–99)
TRIGLYCERIDES: 195 mg/dL — AB (ref 0–149)
VLDL Cholesterol Cal: 39 mg/dL (ref 5–40)

## 2016-09-07 LAB — MICROALBUMIN / CREATININE URINE RATIO
CREATININE, UR: 54.7 mg/dL
Microalb/Creat Ratio: <5.5 mg/g creat (ref 0.0–30.0)

## 2016-09-07 LAB — TSH: TSH: 1.32 u[IU]/mL (ref 0.450–4.500)

## 2016-09-15 ENCOUNTER — Ambulatory Visit: Payer: BLUE CROSS/BLUE SHIELD | Admitting: Family Medicine

## 2016-09-27 ENCOUNTER — Encounter: Payer: Self-pay | Admitting: Family Medicine

## 2016-09-27 ENCOUNTER — Ambulatory Visit (INDEPENDENT_AMBULATORY_CARE_PROVIDER_SITE_OTHER): Payer: BLUE CROSS/BLUE SHIELD | Admitting: Family Medicine

## 2016-09-27 VITALS — BP 122/80 | HR 94 | Temp 98.1°F | Resp 16 | Wt 175.3 lb

## 2016-09-27 DIAGNOSIS — E119 Type 2 diabetes mellitus without complications: Secondary | ICD-10-CM | POA: Diagnosis not present

## 2016-09-27 DIAGNOSIS — E890 Postprocedural hypothyroidism: Secondary | ICD-10-CM

## 2016-09-27 DIAGNOSIS — Z5181 Encounter for therapeutic drug level monitoring: Secondary | ICD-10-CM | POA: Diagnosis not present

## 2016-09-27 DIAGNOSIS — Z23 Encounter for immunization: Secondary | ICD-10-CM

## 2016-09-27 DIAGNOSIS — E7800 Pure hypercholesterolemia, unspecified: Secondary | ICD-10-CM

## 2016-09-27 DIAGNOSIS — R51 Headache: Secondary | ICD-10-CM | POA: Diagnosis not present

## 2016-09-27 DIAGNOSIS — R519 Headache, unspecified: Secondary | ICD-10-CM

## 2016-09-27 NOTE — Assessment & Plan Note (Signed)
Check labs 

## 2016-09-27 NOTE — Assessment & Plan Note (Signed)
Foot exam by MD today; reviewed sugars

## 2016-09-27 NOTE — Assessment & Plan Note (Addendum)
Will use meloxicam for headache if needed; no other NSAIDs while taking; can cause long-term kidney damage; consider nabumetone if needed; reasons to call 911 reviewed

## 2016-09-27 NOTE — Patient Instructions (Addendum)
Try magnesium 250 mg daily Use the meloxicam if needed for headache Try myfitnesspal and step monitoring Check out the information at familydoctor.org entitled "Nutrition for Weight Loss: What You Need to Know about Fad Diets" Try to lose between 1-2 pounds per week by taking in fewer calories and burning off more calories You can succeed by limiting portions, limiting foods dense in calories and fat, becoming more active, and drinking 8 glasses of water a day (64 ounces) Don't skip meals, especially breakfast, as skipping meals may alter your metabolism Do not use over-the-counter weight loss pills or gimmicks that claim rapid weight loss A healthy BMI (or body mass index) is between 18.5 and 24.9 You can calculate your ideal BMI at the NIH website [employer]huhttp://www.nhlbi.nih.gov/health/educational/lose_wt/BMI/bmicalc.htm Return for a weight loss medicine Work up to 150 minutes of activity every week

## 2016-09-27 NOTE — Progress Notes (Signed)
BP 122/80   Pulse 94   Temp 98.1 F (36.7 C) (Oral)   Resp 16   Wt 175 lb 5 oz (79.5 kg)   SpO2 98%   BMI 29.17 kg/m    Subjective:    Patient ID: Susan Bates, female    DOB: Apr 13, 1956, 61 y.o.   MRN: [id number]  HPI: Susan Bates is a 61 y.o. female  Chief Complaint  Patient presents with  . Diabetes  . Hyperlipidemia  . Thyroid Problem    Patient is here for f/u; she has type 2 diabetes mellitus; checking sugars daily on average; reviewed; highest was 181, post-prandial  She has high cholesterol; on simvastatin; no problems; egg consumption, rarely ingests; rarely eat fatty meats, more chicken  She has hypothyroidism; bowel movements are constipated; dry skin  She is overweight; she went on vacation in October; cut sugar back and did lose some; when she eats bread, she still watches carbs; not as good at watching sugars; no sugary drinks; drinks a lot of water; occasional diet Coke  Having headaches; asked for mobic; months ago she was taking mobic for this; it helped; does not want to see a specialist; might need to see chiro; no visual changes; no morning vomiting; right eyes, and will get eyes checked; pain actually starts in the neck and comes forward  [facility] 09/27/2016 03/14/2016  Decreased Interest 0 0  Down, Depressed, Hopeless 1 0  PHQ - 2 Score 1 0   Relevant past medical, surgical, family and social history reviewed Past Medical History:  Diagnosis Date  . Anxiety   . Depression   . Diabetes mellitus without complication (HCC)   . High cholesterol   . Thyroid disease    Past Surgical History:  Procedure Laterality Date  . ABDOMINAL HYSTERECTOMY    . thyriodectomy    . TUBAL LIGATION     Family History  Problem Relation Age of Onset  . Hodgkin's lymphoma Father   . Cancer Sister   . Cervical cancer Sister   . Arthritis Maternal Aunt   . Breast cancer Neg Hx    Social History  Substance Use Topics  . Smoking status:  Never Smoker  . Smokeless tobacco: Never Used  . Alcohol use Yes   Interim medical history since last visit reviewed. Allergies and medications reviewed  Review of Systems Per HPI unless specifically indicated above     Objective:    BP 122/80   Pulse 94   Temp 98.1 F (36.7 C) (Oral)   Resp 16   Wt 175 lb 5 oz (79.5 kg)   SpO2 98%   BMI 29.17 kg/m   Wt Readings from Last 3 Encounters:  09/27/16 175 lb 5 oz (79.5 kg)  05/29/16 165 lb (74.8 kg)  03/14/16 171 lb (77.6 kg)    Physical Exam  Constitutional: She appears well-developed and well-nourished. No distress.  overweight  HENT:  Head: Normocephalic and atraumatic.  Eyes: EOM are normal. No scleral icterus.  Neck: No thyromegaly present.  Cardiovascular: Normal rate, regular rhythm and normal heart sounds.   No murmur heard. Pulmonary/Chest: Effort normal and breath sounds normal. No respiratory distress. She has no wheezes.  Abdominal: Soft. Bowel sounds are normal. She exhibits no distension.  Musculoskeletal: Normal range of motion. She exhibits no edema.  Neurological: She is alert. She exhibits normal muscle tone.  Skin: Skin is warm and dry. She is not diaphoretic. No pallor.  Psychiatric: She  has a normal mood and affect. Her behavior is normal. Judgment and thought content normal.   Diabetic Foot Form - Detailed   Diabetic Foot Exam - detailed Diabetic Foot exam was performed with the following findings:  Yes 09/27/2016  6:13 AM  Visual Foot Exam completed.:  Yes  Are the toenails ingrown?:  No Normal Range of Motion:  Yes Pulse Foot Exam completed.:  Yes  Right Dorsalis Pedis:  Present Left Dorsalis Pedis:  Present  Sensory Foot Exam Completed.:  Yes Semmes-Weinstein Monofilament Test R Site 1-Great Toe:  Pos L Site 1-Great Toe:  Pos  R Site 4:  Pos L Site 4:  Pos  R Site 5:  Pos L Site 5:  Pos          Assessment & Plan:   Problem List Items Addressed This Visit      Endocrine    Post-surgical hypothyroidism    Check labs      Relevant Orders   TSH   T4, free   T3, free   DM type 2 (diabetes mellitus, type 2) (HCC) - Primary    Foot exam by MD today; reviewed sugars      Relevant Orders   Lipid panel   Hemoglobin A1c   Microalbumin / creatinine urine ratio     Other   Medication monitoring encounter    Check labs      Relevant Orders   CBC with Differential/Platelet   Hypercholesteremia    Check fasting labs; avoid saturated      Relevant Medications   spironolactone (ALDACTONE) 25 MG tablet   Other Relevant Orders   Lipid panel   Headache    Will use meloxicam for headache if needed; no other NSAIDs while taking; can cause long-term kidney damage; consider nabumetone if needed; reasons to call [phone] reviewed       Other Visit Diagnoses    Needs flu shot       Relevant Orders   Flu Vaccine QUAD 36+ mos PF IM (Fluarix & Fluzone Quad PF) (Completed)       Follow up plan: Return in about 6 months (around 03/27/2017) for regular follow-up; 6 weeks for weight.  An after-visit summary was printed and given to the patient at check-out.  Please see the patient instructions which may contain other information and recommendations beyond what is mentioned above in the assessment and plan.  Meds ordered this encounter  Medications  . spironolactone (ALDACTONE) 25 MG tablet    Sig: Take 25 mg by mouth daily.    Orders Placed This Encounter  Procedures  . Flu Vaccine QUAD 36+ mos PF IM (Fluarix & Fluzone Quad PF)  . Lipid panel  . CBC with Differential/Platelet  . Hemoglobin A1c  . Microalbumin / creatinine urine ratio  . TSH  . T4, free  . T3, free

## 2016-09-27 NOTE — Assessment & Plan Note (Signed)
Check fasting labs; avoid saturated

## 2016-09-28 ENCOUNTER — Other Ambulatory Visit: Payer: Self-pay | Admitting: Family Medicine

## 2016-09-29 ENCOUNTER — Telehealth: Payer: Self-pay | Admitting: Family Medicine

## 2016-09-29 LAB — CBC WITH DIFFERENTIAL/PLATELET
Basophils Absolute: 0.1 10*3/uL (ref 0.0–0.2)
Basos: 1 %
EOS (ABSOLUTE): 0.3 10*3/uL (ref 0.0–0.4)
EOS: 3 %
HEMATOCRIT: 43.7 % (ref 34.0–46.6)
Hemoglobin: 14.9 g/dL (ref 11.1–15.9)
IMMATURE GRANS (ABS): 0.1 10*3/uL (ref 0.0–0.1)
IMMATURE GRANULOCYTES: 1 %
LYMPHS: 29 %
Lymphocytes Absolute: 2.6 10*3/uL (ref 0.7–3.1)
MCH: 30.8 pg (ref 26.6–33.0)
MCHC: 34.1 g/dL (ref 31.5–35.7)
MCV: 90 fL (ref 79–97)
Monocytes Absolute: 0.5 10*3/uL (ref 0.1–0.9)
Monocytes: 5 %
NEUTROS ABS: 5.5 10*3/uL (ref 1.4–7.0)
NEUTROS PCT: 61 %
Platelets: 355 10*3/uL (ref 150–379)
RBC: 4.84 x10E6/uL (ref 3.77–5.28)
RDW: 14 % (ref 12.3–15.4)
WBC: 9 10*3/uL (ref 3.4–10.8)

## 2016-09-29 LAB — LIPID PANEL W/O CHOL/HDL RATIO
Cholesterol, Total: 193 mg/dL (ref 100–199)
HDL: 40 mg/dL (ref >39)
LDL CALC: 123 mg/dL — AB (ref 0–99)
Triglycerides: 151 mg/dL — ABNORMAL HIGH (ref 0–149)
VLDL CHOLESTEROL CAL: 30 mg/dL (ref 5–40)

## 2016-09-29 LAB — MICROALBUMIN / CREATININE URINE RATIO
CREATININE, UR: 43.9 mg/dL
MICROALB/CREAT RATIO: 10.5 mg/g{creat} (ref 0.0–30.0)
MICROALBUM., U, RANDOM: 4.6 ug/mL

## 2016-09-29 LAB — HGB A1C W/O EAG: Hgb A1c MFr Bld: 5.7 % — ABNORMAL HIGH (ref 4.8–5.6)

## 2016-09-29 MED ORDER — MELOXICAM 15 MG PO TABS: 15.0000 mg | ORAL_TABLET | Freq: Every day | ORAL | 2 refills | Status: AC

## 2016-09-29 NOTE — Telephone Encounter (Signed)
Pt states that you guys had discussed her taking meloxicam. She went to pharmacy and it was not there. Please send to [email or web address]

## 2016-09-29 NOTE — Telephone Encounter (Signed)
Rx sent 

## 2016-09-29 NOTE — Telephone Encounter (Addendum)
I personally called to apologize for not sending the Rx; I called and left detailed message Take aspirin at least one hour before the meloxicam Call if anything I can do

## 2016-10-01 LAB — SPECIMEN STATUS REPORT

## 2016-10-01 LAB — T3, FREE: T3, Free: 2.9 pg/mL (ref 2.0–4.4)

## 2016-10-01 LAB — T4, FREE: Free T4: 0.75 ng/dL — ABNORMAL LOW (ref 0.82–1.77)

## 2016-10-01 LAB — TSH: TSH: 2.12 u[IU]/mL (ref 0.450–4.500)

## 2016-10-30 ENCOUNTER — Other Ambulatory Visit: Payer: Self-pay | Admitting: Family Medicine

## 2016-10-30 DIAGNOSIS — N6320 Unspecified lump in the left breast, unspecified quadrant: Secondary | ICD-10-CM

## 2016-11-08 ENCOUNTER — Ambulatory Visit: Payer: BLUE CROSS/BLUE SHIELD | Admitting: Family Medicine

## 2016-11-14 ENCOUNTER — Other Ambulatory Visit: Payer: Self-pay

## 2016-11-14 ENCOUNTER — Telehealth: Payer: Self-pay | Admitting: Family Medicine

## 2016-11-14 DIAGNOSIS — N6320 Unspecified lump in the left breast, unspecified quadrant: Secondary | ICD-10-CM

## 2016-11-14 NOTE — Telephone Encounter (Signed)
This has already been scheduled and ordered for 5/1

## 2016-11-14 NOTE — Telephone Encounter (Signed)
Can you please order. Thank you

## 2016-11-14 NOTE — Telephone Encounter (Signed)
-----   Message from Kerman Passey, MD sent at 06/02/2016  2:37 PM EDT ----- Regarding: Breast US rec mid APRIL 2018 IMPRESSION: 1.  There is a probably benign left breast mass at 12 o'clock.  RECOMMENDATION: Six-month follow-up left breast ultrasound is recommended.

## 2016-12-04 ENCOUNTER — Ambulatory Visit: Payer: BLUE CROSS/BLUE SHIELD

## 2016-12-12 ENCOUNTER — Ambulatory Visit
Admission: RE | Admit: 2016-12-12 | Discharge: 2016-12-12 | Disposition: A | Discharge status: Home / Self Care | Payer: BLUE CROSS/BLUE SHIELD | Source: Ambulatory Visit | Attending: Family Medicine | Admitting: Family Medicine

## 2016-12-12 DIAGNOSIS — N6320 Unspecified lump in the left breast, unspecified quadrant: Secondary | ICD-10-CM

## 2016-12-12 DIAGNOSIS — N6322 Unspecified lump in the left breast, upper inner quadrant: Secondary | ICD-10-CM | POA: Diagnosis not present

## 2016-12-31 ENCOUNTER — Other Ambulatory Visit: Payer: Self-pay | Admitting: Family Medicine

## 2017-01-01 NOTE — Telephone Encounter (Signed)
Last OV: 10/01/15 Next OV: none on file   Lab Results  Component Value Date   HGBA1C 5.7 (H) 09/28/2016

## 2017-03-09 ENCOUNTER — Other Ambulatory Visit: Payer: Self-pay | Admitting: Family Medicine

## 2017-03-09 NOTE — Telephone Encounter (Signed)
Last sgpt and lipids reviewed; Rx approved 

## 2017-03-27 ENCOUNTER — Ambulatory Visit: Payer: BLUE CROSS/BLUE SHIELD | Admitting: Family Medicine

## 2017-05-11 ENCOUNTER — Encounter: Payer: Self-pay | Admitting: Family Medicine

## 2017-05-11 ENCOUNTER — Other Ambulatory Visit: Payer: Self-pay | Admitting: Family Medicine

## 2017-05-11 ENCOUNTER — Ambulatory Visit (INDEPENDENT_AMBULATORY_CARE_PROVIDER_SITE_OTHER): Payer: BLUE CROSS/BLUE SHIELD | Admitting: Family Medicine

## 2017-05-11 VITALS — BP 128/78 | HR 79 | Temp 98.0°F | Resp 14 | Wt 180.5 lb

## 2017-05-11 DIAGNOSIS — Z23 Encounter for immunization: Secondary | ICD-10-CM | POA: Diagnosis not present

## 2017-05-11 DIAGNOSIS — R51 Headache: Secondary | ICD-10-CM

## 2017-05-11 DIAGNOSIS — Z5181 Encounter for therapeutic drug level monitoring: Secondary | ICD-10-CM | POA: Diagnosis not present

## 2017-05-11 DIAGNOSIS — E119 Type 2 diabetes mellitus without complications: Secondary | ICD-10-CM | POA: Diagnosis not present

## 2017-05-11 DIAGNOSIS — J321 Chronic frontal sinusitis: Secondary | ICD-10-CM

## 2017-05-11 DIAGNOSIS — M47812 Spondylosis without myelopathy or radiculopathy, cervical region: Secondary | ICD-10-CM

## 2017-05-11 DIAGNOSIS — M4692 Unspecified inflammatory spondylopathy, cervical region: Secondary | ICD-10-CM | POA: Diagnosis not present

## 2017-05-11 DIAGNOSIS — G8929 Other chronic pain: Secondary | ICD-10-CM

## 2017-05-11 DIAGNOSIS — E890 Postprocedural hypothyroidism: Secondary | ICD-10-CM | POA: Diagnosis not present

## 2017-05-11 DIAGNOSIS — E7800 Pure hypercholesterolemia, unspecified: Secondary | ICD-10-CM | POA: Diagnosis not present

## 2017-05-11 MED ORDER — AMOXICILLIN-POT CLAVULANATE 875-125 MG PO TABS: 1.0000 | ORAL_TABLET | Freq: Two times a day (BID) | ORAL | 0 refills | Status: DC

## 2017-05-11 MED ORDER — AMOXICILLIN-POT CLAVULANATE 875-125 MG PO TABS: 1.0000 | ORAL_TABLET | Freq: Two times a day (BID) | ORAL | 0 refills | Status: AC

## 2017-05-11 MED ORDER — FLUTICASONE PROPIONATE 50 MCG/ACT NA SUSP: 2.0000 | Freq: Every day | NASAL | 6 refills | Status: DC

## 2017-05-11 NOTE — Progress Notes (Signed)
BP 128/78   Pulse 79   Temp 98 F (36.7 C) (Oral)   Resp 14   Wt 180 lb 8 oz (81.9 kg)   SpO2 97%   BMI 30.04 kg/m    Subjective:    Patient ID: Susan Bates, female    DOB: 1955/08/16, 61 y.o.   MRN: [id number]  HPI: Susan Bates is a 61 y.o. female  Chief Complaint  Patient presents with  . Follow-up    HPI Here for follow-up Type 2 diabetes; no problems with feet; sugars are doing okay; checking FSBS at home, 127 this morning; averages 123, occasionally 154 at the highest; no real dry mouth; keeps forgetting to take her metformin; might come down more if she could remember to take it  Hypothyroidism; not constipated; more tired though; weight going up Lab Results  Component Value Date   TSH 2.120 09/28/2016   High cholesterol; trying to limit fatty meats; rare eggs; on statin  Having headaches; maybe coming from her neck; maybe allergies; back of the skull and behind eyes; headaches are several times a week; flourescent bulbs on the airplane; not sure if from neck; hears some crunching and popping when she moves her head/neck; she knows she has arthritis in her neck; she passed out in 2007 and was admitted to the hospital, did CT scan; saw arthritis in her neck; mental exhaustion, son had been injured; having a lot of sinus drainage and pressure; taking alka-seltzer cold plus medicine which relieves it; also takes tension excedrin headache medicine  Depression screen [facility] 2/9 05/11/2017 09/27/2016 03/14/2016  Decreased Interest 0 0 0  Down, Depressed, Hopeless 0 1 0  PHQ - 2 Score 0 1 0    Relevant past medical, surgical, family and social history reviewed Past Medical History:  Diagnosis Date  . Anxiety   . Depression   . Diabetes mellitus without complication (HCC)   . High cholesterol   . Thyroid disease    Past Surgical History:  Procedure Laterality Date  . ABDOMINAL HYSTERECTOMY    . thyriodectomy    . TUBAL LIGATION     Family History  Problem  Relation Age of Onset  . Hodgkin's lymphoma Father   . Cancer Sister   . Cervical cancer Sister   . Arthritis Maternal Aunt   . Breast cancer Neg Hx    Social History   Social History  . Marital status: Married    Spouse name: N/A  . Number of children: N/A  . Years of education: N/A   Occupational History  . Not on file.   Social History Main Topics  . Smoking status: Never Smoker  . Smokeless tobacco: Never Used  . Alcohol use Yes  . Drug use: No  . Sexual activity: Yes   Other Topics Concern  . Not on file   Social History Narrative  . No narrative on file    Interim medical history since last visit reviewed. Allergies and medications reviewed  Review of Systems Per HPI unless specifically indicated above     Objective:    BP 128/78   Pulse 79   Temp 98 F (36.7 C) (Oral)   Resp 14   Wt 180 lb 8 oz (81.9 kg)   SpO2 97%   BMI 30.04 kg/m   Wt Readings from Last 3 Encounters:  05/11/17 180 lb 8 oz (81.9 kg)  09/27/16 175 lb 5 oz (79.5 kg)  05/29/16 165 lb (74.8 kg)  Physical Exam  Constitutional: She appears well-developed and well-nourished. No distress.  overweight  HENT:  Head: Normocephalic and atraumatic.  Right Ear: Tympanic membrane and ear canal normal. Tympanic membrane is not erythematous and not retracted.  Left Ear: Tympanic membrane and ear canal normal. Tympanic membrane is not erythematous and not retracted.  Nose: Mucosal edema and rhinorrhea present. Right sinus exhibits frontal sinus tenderness. Left sinus exhibits frontal sinus tenderness.  Mouth/Throat: Oropharynx is clear and moist and mucous membranes are normal.  Eyes: EOM are normal. No scleral icterus.  Neck: No thyromegaly present.  Cardiovascular: Normal rate, regular rhythm and normal heart sounds.   No murmur heard. Pulmonary/Chest: Effort normal and breath sounds normal. No respiratory distress. She has no wheezes.  Abdominal: Soft. Bowel sounds are normal. She  exhibits no distension.  Musculoskeletal: Normal range of motion. She exhibits no edema.  Lymphadenopathy:    She has no cervical adenopathy.  Neurological: She is alert. She exhibits normal muscle tone.  Skin: Skin is warm and dry. She is not diaphoretic. No pallor.  Psychiatric: She has a normal mood and affect. Her behavior is normal. Judgment and thought content normal.   Diabetic Foot Form - Detailed   Diabetic Foot Exam - detailed Diabetic Foot exam was performed with the following findings:  Yes 05/11/2017  9:20 AM  Visual Foot Exam completed.:  Yes  Pulse Foot Exam completed.:  Yes  Right Dorsalis Pedis:  Present Left Dorsalis Pedis:  Present  Sensory Foot Exam Completed.:  Yes Semmes-Weinstein Monofilament Test R Site 1-Great Toe:  Pos L Site 1-Great Toe:  Pos        Results for orders placed or performed in visit on 09/28/16  CBC with Differential/Platelet  Result Value Ref Range   WBC 9.0 3.4 - 10.8 x10E3/uL   RBC 4.84 3.77 - 5.28 x10E6/uL   Hemoglobin 14.9 11.1 - 15.9 g/dL   Hematocrit 16.1 09.6 - 46.6 %   MCV 90 79 - 97 fL   MCH 30.8 26.6 - 33.0 pg   MCHC 34.1 31.5 - 35.7 g/dL   RDW 04.5 40.9 - 81.1 %   Platelets 355 150 - 379 x10E3/uL   Neutrophils 61 Not Estab. %   Lymphs 29 Not Estab. %   Monocytes 5 Not Estab. %   Eos 3 Not Estab. %   Basos 1 Not Estab. %   Neutrophils Absolute 5.5 1.4 - 7.0 x10E3/uL   Lymphocytes Absolute 2.6 0.7 - 3.1 x10E3/uL   Monocytes Absolute 0.5 0.1 - 0.9 x10E3/uL   EOS (ABSOLUTE) 0.3 0.0 - 0.4 x10E3/uL   Basophils Absolute 0.1 0.0 - 0.2 x10E3/uL   Immature Granulocytes 1 Not Estab. %   Immature Grans (Abs) 0.1 0.0 - 0.1 x10E3/uL  Lipid Panel w/o Chol/HDL Ratio  Result Value Ref Range   Cholesterol, Total 193 100 - 199 mg/dL   Triglycerides 914 (H) 0 - 149 mg/dL   HDL 40 >78 mg/dL   VLDL Cholesterol Cal 30 5 - 40 mg/dL   LDL Calculated 295 (H) 0 - 99 mg/dL  Microalbumin / creatinine urine ratio  Result Value Ref Range    Creatinine, Urine 43.9 Not Estab. mg/dL   Albumin, Urine 4.6 Not Estab. ug/mL   Microalb/Creat Ratio 10.5 0.0 - 30.0 mg/g creat  Hgb A1c w/o eAG  Result Value Ref Range   Hgb A1c MFr Bld 5.7 (H) 4.8 - 5.6 %  TSH  Result Value Ref Range   TSH 2.120 0.450 -  4.500 uIU/mL  T4, free  Result Value Ref Range   Free T4 0.75 (L) 0.82 - 1.77 ng/dL  T3, free  Result Value Ref Range   T3, Free 2.9 2.0 - 4.4 pg/mL  Specimen status report  Result Value Ref Range   specimen status report Comment       Assessment & Plan:   Problem List Items Addressed This Visit      Endocrine   Post-surgical hypothyroidism    Check TSH today with weight gain      Relevant Orders   TSH   T4, free   DM type 2 (diabetes mellitus, type 2) (HCC)    Foot exam by MD today; pneumonia vaccine UTD; taking baby aspirin; check A1c today      Relevant Orders   Hemoglobin A1c   Lipid panel     Musculoskeletal and Integument   Cervical arthritis    Known arthritis from scan years ago per patient; will not re-image now; will refer to PT; if symptoms persist in 4 weeks, consider MRI      Relevant Orders   Ambulatory referral to Physical Therapy     Other   Medication monitoring encounter    Check liver and kidneys      Relevant Orders   Comprehensive metabolic panel   Hypercholesteremia    Check fasting lipids; try to limit fatty meats and cheese and egg yolks      Relevant Orders   Lipid panel   Headache    May be combination of posterior headache from cervical spine arthritis and frontal headache from chronic sinusitis; will treat with nasal corticosteroid and antibiotics plus PT; if headache not resolving or resolved in 4 weeks, patient to let me know and we'll proceed with further work-up      Relevant Orders   Ambulatory referral to Physical Therapy    Other Visit Diagnoses    Chronic frontal sinusitis    -  Primary   treat with nasal corticosteroids, antibiotics   Relevant Medications    amoxicillin-clavulanate (AUGMENTIN) 875-125 MG tablet   fluticasone (FLONASE) 50 MCG/ACT nasal spray   Needs flu shot       Relevant Orders   Flu Vaccine QUAD 6+ mos PF IM (Fluarix Quad PF) (Completed)       Follow up plan: Return in about 6 months (around 11/08/2017) for twenty minute follow-up with fasting labs.  An after-visit summary was printed and given to the patient at check-out.  Please see the patient instructions which may contain other information and recommendations beyond what is mentioned above in the assessment and plan.  Meds ordered this encounter  Medications  . DISCONTD: amoxicillin-clavulanate (AUGMENTIN) 875-125 MG tablet    Sig: Take 1 tablet by mouth 2 (two) times daily.    Dispense:  20 tablet    Refill:  0  . DISCONTD: fluticasone (FLONASE) 50 MCG/ACT nasal spray    Sig: Place 2 sprays into both nostrils daily.    Dispense:  16 g    Refill:  6  . amoxicillin-clavulanate (AUGMENTIN) 875-125 MG tablet    Sig: Take 1 tablet by mouth 2 (two) times daily.    Dispense:  20 tablet    Refill:  0  . fluticasone (FLONASE) 50 MCG/ACT nasal spray    Sig: Place 2 sprays into both nostrils daily.    Dispense:  16 g    Refill:  6    Orders Placed This Encounter  Procedures  .  Flu Vaccine QUAD 6+ mos PF IM (Fluarix Quad PF)  . Hemoglobin A1c  . Lipid panel  . TSH  . T4, free  . Comprehensive metabolic panel  . Ambulatory referral to Physical Therapy

## 2017-05-11 NOTE — Patient Instructions (Signed)
Let's start the antibiotics and nasal spray Please do eat yogurt daily or take a probiotic daily for the next month or two We want to replace the healthy germs in the gut If you notice foul, watery diarrhea in the next two months, schedule an appointment RIGHT AWAY

## 2017-05-11 NOTE — Assessment & Plan Note (Signed)
Check fasting lipids; try to limit fatty meats and cheese and egg yolks

## 2017-05-11 NOTE — Assessment & Plan Note (Signed)
Known arthritis from scan years ago per patient; will not re-image now; will refer to PT; if symptoms persist in 4 weeks, consider MRI

## 2017-05-11 NOTE — Assessment & Plan Note (Signed)
Check liver and kidneys 

## 2017-05-11 NOTE — Assessment & Plan Note (Signed)
May be combination of posterior headache from cervical spine arthritis and frontal headache from chronic sinusitis; will treat with nasal corticosteroid and antibiotics plus PT; if headache not resolving or resolved in 4 weeks, patient to let me know and we'll proceed with further work-up

## 2017-05-11 NOTE — Assessment & Plan Note (Signed)
Check TSH today with weight gain

## 2017-05-11 NOTE — Assessment & Plan Note (Signed)
Foot exam by MD today; pneumonia vaccine UTD; taking baby aspirin; check A1c today

## 2017-05-12 LAB — COMPREHENSIVE METABOLIC PANEL
ALBUMIN: 4.5 g/dL (ref 3.5–5.5)
ALT: 15 IU/L (ref 0–32)
AST: 16 IU/L (ref 0–40)
Albumin/Globulin Ratio: 1.8 (ref 1.2–2.2)
Alkaline Phosphatase: 55 IU/L (ref 39–117)
BILIRUBIN TOTAL: 0.3 mg/dL (ref 0.0–1.2)
BUN / CREAT RATIO: 16 (ref 9–23)
BUN: 12 mg/dL (ref 6–24)
CHLORIDE: 96 mmol/L (ref 96–106)
CO2: 25 mmol/L (ref 20–29)
CREATININE: 0.76 mg/dL (ref 0.57–1.00)
Calcium: 9.3 mg/dL (ref 8.7–10.2)
GFR, EST AFRICAN AMERICAN: 99 mL/min/{1.73_m2} (ref >59)
GFR, EST NON AFRICAN AMERICAN: 86 mL/min/{1.73_m2} (ref >59)
GLUCOSE: 98 mg/dL (ref 65–99)
Globulin, Total: 2.5 g/dL (ref 1.5–4.5)
Potassium: 4.5 mmol/L (ref 3.5–5.2)
Sodium: 135 mmol/L (ref 134–144)
TOTAL PROTEIN: 7 g/dL (ref 6.0–8.5)

## 2017-05-12 LAB — LIPID PANEL
CHOLESTEROL TOTAL: 229 mg/dL — AB (ref 100–199)
Chol/HDL Ratio: 6 ratio — ABNORMAL HIGH (ref 0.0–4.4)
HDL: 38 mg/dL — ABNORMAL LOW (ref >39)
LDL CALC: 148 mg/dL — AB (ref 0–99)
TRIGLYCERIDES: 214 mg/dL — AB (ref 0–149)
VLDL Cholesterol Cal: 43 mg/dL — ABNORMAL HIGH (ref 5–40)

## 2017-05-12 LAB — HEMOGLOBIN A1C
Est. average glucose Bld gHb Est-mCnc: 131 mg/dL
Hgb A1c MFr Bld: 6.2 % — ABNORMAL HIGH (ref 4.8–5.6)

## 2017-05-12 LAB — T4, FREE: Free T4: 0.77 ng/dL — ABNORMAL LOW (ref 0.82–1.77)

## 2017-05-12 LAB — TSH: TSH: 2.33 u[IU]/mL (ref 0.450–4.500)

## 2017-05-16 ENCOUNTER — Other Ambulatory Visit: Payer: Self-pay | Admitting: Family Medicine

## 2017-05-16 DIAGNOSIS — Z5181 Encounter for therapeutic drug level monitoring: Secondary | ICD-10-CM

## 2017-05-16 DIAGNOSIS — E7800 Pure hypercholesterolemia, unspecified: Secondary | ICD-10-CM

## 2017-05-16 MED ORDER — SIMVASTATIN 40 MG PO TABS: 40.0000 mg | ORAL_TABLET | Freq: Every day | ORAL | 1 refills | Status: DC

## 2017-05-16 NOTE — Progress Notes (Unsigned)
I spoke with patient about labs; she wishes to stay on the armour thyroid, works well We'll increase statin; discussed MI, stroke, complications from high chol with diabetes Recheck fasting labs in 8 weeks Work on diet, weight loss ----------------------------------- Susan Bates, please print off the orders and mail to patient. She will have them done at [facility]. Thank you

## 2017-05-17 ENCOUNTER — Other Ambulatory Visit: Payer: Self-pay

## 2017-05-17 DIAGNOSIS — Z5181 Encounter for therapeutic drug level monitoring: Secondary | ICD-10-CM

## 2017-05-17 DIAGNOSIS — E7800 Pure hypercholesterolemia, unspecified: Secondary | ICD-10-CM

## 2017-05-21 ENCOUNTER — Telehealth: Payer: Self-pay | Admitting: Family Medicine

## 2017-05-21 DIAGNOSIS — N6459 Other signs and symptoms in breast: Secondary | ICD-10-CM | POA: Insufficient documentation

## 2017-05-21 NOTE — Assessment & Plan Note (Signed)
See last report; ordering for f/u

## 2017-05-21 NOTE — Telephone Encounter (Signed)
Orders entered

## 2017-05-21 NOTE — Telephone Encounter (Signed)
-----   Message from Kerman Passey, MD sent at 12/12/2016  4:51 PM EDT ----- Regarding: due for breast imaging in Oct 2018 RECOMMENDATION: Follow-up bilateral diagnostic mammogram and left breast ultrasound is recommended in October of 2018.

## 2017-05-28 ENCOUNTER — Ambulatory Visit: Payer: BLUE CROSS/BLUE SHIELD | Attending: Family Medicine

## 2017-05-28 DIAGNOSIS — M542 Cervicalgia: Secondary | ICD-10-CM | POA: Diagnosis present

## 2017-05-28 DIAGNOSIS — G8929 Other chronic pain: Secondary | ICD-10-CM

## 2017-05-28 DIAGNOSIS — R51 Headache: Secondary | ICD-10-CM | POA: Diagnosis present

## 2017-05-28 NOTE — Therapy (Signed)
[place] [facility] [address] [place], [place], [address] Phone: [phone]   Fax:  [phone]  Physical Therapy Evaluation  Patient Details  Name: Susan Bates MRN: [id number] Date of Birth: 03/31/1956 Referring Provider: Dr. Sherie Don  Encounter Date: 05/28/2017      PT End of Session - 05/28/17 0946    Visit Number 1   Number of Visits 13   Date for PT Re-Evaluation 07/09/17   PT Start Time 0945   PT Stop Time 1030   PT Time Calculation (min) 45 min   Equipment Utilized During Treatment Gait belt   Activity Tolerance Patient tolerated treatment well   Behavior During Therapy [facility] for tasks assessed/performed      Past Medical History:  Diagnosis Date  . Anxiety   . Depression   . Diabetes mellitus without complication (HCC)   . High cholesterol   . Thyroid disease     Past Surgical History:  Procedure Laterality Date  . ABDOMINAL HYSTERECTOMY    . thyriodectomy    . TUBAL LIGATION      There were no vitals filed for this visit.       Subjective Assessment - 05/28/17 1001    Subjective neck pain   Pertinent History Pt reports history of headache for multiple years. Headaches are several times a week approximately every other day. She has a history of migraines but hasn't had one in approximately 4 months. 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 She also has a history of neck pain for multiple years. She suffered a syncopal episode in 2007 with head trauma. She had a cervical spine CT scan at that time which revealed arthritis. Pt reports bilateral UE numbness L>R while she sleeps but occasionally during the day which has worsened over the last year. She states that the numbness extends down her entire arms bilaterally. She has noticed decreased grip strength as well. She now is reporting numbness extending into LLE for the last 3 months. She notices numbness in her L arm and LLE at the  same time but is unsure if they are related. She feels tightness in the muscles of her neck. "It feels like if it would pop it would feel better." She reports that she hears some crunching and popping when she moves her head/neck. [place] has [place] and it helped with her migraines. No reported changes in symptoms in AM compared to PM. Pt reports occasionally dizzy but not presyncopal. She reports some night sweats. Pain wakes her up at night. Denies chills, fevers, loss of bowel/bladder control.    Diagnostic tests Arthritis in spine from cervical CT   Patient Stated Goals "It would be nice not to wake up at night with arms numb". Improve headaches. No functional limitations   Currently in Pain? Yes   Pain Score 5   Worst: 10/10, Best: 0/10   Pain Location Neck   Pain Orientation --  Central upper, mid, and lower   Pain Descriptors / Indicators Aching;Burning   Pain Type Chronic pain   Pain Radiating Towards Bilateral UE, numbness in LLE   Pain Frequency Intermittent   Aggravating Factors  pushing/pulling carts at work, Clinical research associate, quick/jerky movement   Pain Relieving Factors no position of comfort, meloxicam, excedrin tension, hasn't tried ice/heat            [facility] PT Assessment -  05/28/17 1010      Assessment   Medical Diagnosis Cervical arthritis   Referring Provider Dr. Sherie Don   Onset Date/Surgical Date 05/28/16   Hand Dominance Left  ambidextrous   Next MD Visit Nothing scheduled   Prior Therapy Chiropractic 2007, no Pt for neck     Precautions   Precautions None     Restrictions   Weight Bearing Restrictions No     Balance Screen   Has the patient fallen in the past 6 months No   Has the patient had a decrease in activity level because of a fear of falling?  No   Is the patient reluctant to leave their home because of a fear of falling?  No     Home Tourist information centre manager residence     Prior  Function   Level of Independence Independent     Cognition   Overall Cognitive Status Within Functional Limits for tasks assessed     Observation/Other Assessments   Other Surveys  Other Surveys   Neck Disability Index  22%     Sensation   Additional Comments  She reports intact sensation to light touch throughout face and bilateral UEs C2-T2     Posture/Postural Control   Posture Comments Mild forward head posture and rounded shoulder but not significant     ROM / Strength   AROM / PROM / Strength AROM;Strength     AROM   Overall AROM Comments Bilateral UE and LE strength grossly 4+ to 5/5 without focal weakness. Will test grip strength at next visit.    AROM Assessment Site Cervical   Cervical Flexion 55  pain with OP, stretching and tinglingin LUE   Cervical Extension 45  pain with OP with increase in LUE tingling   Cervical - Right Side Bend 40  Pain with OP   Cervical - Left Side Bend 40  Pain with OP   Cervical - Right Rotation 65   Cervical - Left Rotation 65   increase in LLE tingling with L rotation     Strength   Overall Strength Comments Bilateral UE and LE strength grossly 4+ to 5/5 without focal weakness. Will test grip strength at next visit.      Palpation   Palpation comment mildly tender along L cervical paraspinals to palpation but some reports of increased numbness down L arm. Cervical and thoracic mobility with CPA and bilateral UPA appears grossly WNL. Pt reports L facial numbness with CPA and L UPA at C2. Increase in LUE numbness with CPA at C4-T4 and UPA at C2-C6. Peripheralization with both repeated protraction and retraction but possibly more irritable with protraction. Positive Hoffman bilaterally. Reflex testing deferred today due to time constraints.     Ambulation/Gait   Gait Comments Independent            Objective measurements completed on examination: See above findings.        TREATMENT  Pt instructed in supine cervical  retractions x 10 with 3s hold, provided written HEP with education about how to complete correctly (unbilled);                              PT Education - 05/28/17 0944    Education provided Yes   Education Details HEP and plan of care   Person(s) Educated Patient   Methods Explanation   Comprehension Verbalized understanding  PT Short Term Goals - 05/28/17 1110      PT SHORT TERM GOAL #1   Title Pt will be independent with HEP in order to decrease neck pain so she can improve pain-free function at home and work.   Time 3   Period Weeks   Status New   Target Date 06/18/17           PT Long Term Goals - 05/28/17 1111      PT LONG TERM GOAL #1   Title Pt will demonstrate decrease in NDI by at least 19% in order to demonstrate clinically significant reduction in disability related to neck injury/pain    Baseline 05/28/17: 22%   Time 6   Period Weeks   Status New     PT LONG TERM GOAL #2   Title Pt will decrease worst pain as reported on NPRS by at least 2 points in order to demonstrate clinically significant reduction in pain.   Baseline 05/28/17: worst 10/10   Time 6   Period Weeks   Status New   Target Date 07/09/17     PT LONG TERM GOAL #3   Title Pt will report no further numbness/tingling in LUE in order to improve her ability to sleep for a full night without waking up   Baseline 05/28/17: numbness/tingling wakes her up at night   Time 6   Period Weeks   Status New   Target Date 07/09/17     PT LONG TERM GOAL #4   Title Pt will report decrease in her headache frequency to once/week at most in order to improve her ability to function at work   Baseline 05/28/17: every other day   Time 6   Period Weeks   Status New   Target Date 07/09/17                Plan - 05/28/17 1106    Clinical Impression Statement Pt is a pleasant 61 yo female referred for neck pain with headaches. Headaches occur approximately every other  day and tend to start in her upper cervical spine/posterior skull. Pt reports bilateral UE numbness L>R while she sleeps but occasionally during the day. She states that the numbness extends down her entire arm. She has noticed decreased grip strength as well. She now is reporting radicular symptoms into LLE for the last 3 months. Pain and numbness occasionally wake her up at night. Pt reports occasionally dizzy but no presyncopal symptoms. She reports some night sweats but attributes it to her age. Denies chills, fevers, loss of bowel/bladder control. PT evaluation reveals pain with overpressure at end range flexion, extension, bilateral lateral flexion, and left rotation with increase in LUE symptoms with all motions. Symptoms appears to peripheralize with repeated cervical protraction and retraction but worse with protraction/flexion. She is mildly tender along L cervical paraspinals to palpation and reports some increase in numbness down L arm. Cervical and thoracic mobility with CPA and bilateral UPA appears grossly WNL. Pt reports L facial numbness with CPA and L UPA at C2. Increase in LUE numbness with CPA at C4-T4 and UPA at C2-C6. Positive Hoffman bilaterally. She reports intact sensation to light touch throughout face and bilateral UEs. No signs of muscle wasting/atrophy. Pt issued HEP. PT examination is concerning for possible central cord compression given bilateral UE numbness/tingling concurrently with LLE symptoms. She will benefit from skilled PT services to assess response to conservative management. If she does not demonstrate improvement with therapy it would  be reasonable to consider further imaging of her cervical spine.   History and Personal Factors relevant to plan of care: 2 personal factors/comorbidities, 3 body systems/activity limitations/participation restrictions    Clinical Presentation Evolving   Clinical Presentation due to: highly fluctuating pain, now pain in LLE   Clinical  Decision Making Moderate   Rehab Potential Good   PT Frequency 2x / week   PT Duration 6 weeks   PT Treatment/Interventions ADLs/Self Care Home Management;Biofeedback;Cryotherapy;Electrical Stimulation;Iontophoresis /ml Dexamethasone;Moist Heat;Traction;Ultrasound;DME Instruction;Therapeutic activities;Therapeutic exercise;Neuromuscular re-education;Manual techniques;Patient/family education;Passive range of motion;Dry needling   PT Next Visit Plan Assess response to repeated retractions (continue/discontinue pending response), spurlings/distraction test, check grip strength, cervical isometric strength/pain testing, thoracic outlet testing for L side, manual techniques for neck;    PT Home Exercise Plan repeated cervical retractions (see instructions)   Consulted and Agree with Plan of Care Patient      Patient will benefit from skilled therapeutic intervention in order to improve the following deficits and impairments:  Impaired sensation, Pain, Decreased range of motion  Visit Diagnosis: Cervicalgia - Plan: PT plan of care cert/re-cert  Chronic nonintractable headache, unspecified headache type - Plan: PT plan of care cert/re-cert     Problem List Patient Active Problem List   Diagnosis Date Noted  . Abnormal breast finding 05/21/2017  . Cervical arthritis 05/11/2017  . Headache 09/27/2016  . DM type 2 (diabetes mellitus, type 2) (HCC) 10/01/2015  . Post-surgical hypothyroidism 10/01/2015  . Hypercholesteremia 10/01/2015  . Medication monitoring encounter 10/01/2015  . Need for hepatitis C screening test 10/01/2015  . Screening for HIV (human immunodeficiency virus) 10/01/2015   Lynnea Maizes PT, DPT   Dane Bloch 05/28/2017, 11:19 AM  [facility] [address] [place], [place], [address] Phone: [phone]   Fax:  [phone]  Name: Susan Bates MRN: [id number] Date of Birth: 12-25-1955

## 2017-06-01 ENCOUNTER — Encounter: Payer: Self-pay | Admitting: Physical Therapy

## 2017-06-01 ENCOUNTER — Ambulatory Visit: Payer: BLUE CROSS/BLUE SHIELD | Admitting: Physical Therapy

## 2017-06-01 DIAGNOSIS — R51 Headache: Secondary | ICD-10-CM

## 2017-06-01 DIAGNOSIS — G8929 Other chronic pain: Secondary | ICD-10-CM

## 2017-06-01 DIAGNOSIS — M542 Cervicalgia: Secondary | ICD-10-CM

## 2017-06-01 NOTE — Therapy (Signed)
[place] [facility] [address] [place], [place], [address] Phone: [phone]   Fax:  [phone]  Physical Therapy Treatment  Patient Details  Name: Susan Bates MRN: [id number] Date of Birth: 1956-05-24 Referring Provider: Dr. Sherie DonLada  Encounter Date: 06/01/2017      PT End of Session - 06/01/17 1757    Visit Number 2   Number of Visits 13   Date for PT Re-Evaluation 07/09/17   PT Start Time 0814   PT Stop Time 0901   PT Time Calculation (min) 47 min   Equipment Utilized During Treatment Gait belt   Activity Tolerance Patient tolerated treatment well;No increased pain   Behavior During Therapy WFL for tasks assessed/performed      Past Medical History:  Diagnosis Date  . Anxiety   . Depression   . Diabetes mellitus without complication (HCC)   . High cholesterol   . Thyroid disease     Past Surgical History:  Procedure Laterality Date  . ABDOMINAL HYSTERECTOMY    . thyriodectomy    . TUBAL LIGATION      There were no vitals filed for this visit.      Subjective Assessment - 06/01/17 0815    Subjective Pt reports that her numbness and tingling are decreasing and she has not been waking up with pain. She has been doing her HEP with minor discomfort, but she feels that her symptoms are improving.   Pertinent History Pt reports history of headache for multiple years. Headaches are several times a week approximately every other day. She has a history of migraines but hasn't had one in approximately 4 months. 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 She also has a history of neck pain for multiple years. She suffered a syncopal episode in 2007 with head trauma. She had a cervical spine CT scan at that time which revealed arthritis. Pt reports bilateral UE numbness L>R while she sleeps but occasionally during the day which has worsened over the last year. She states that the numbness extends down  her entire arms bilaterally. She has noticed decreased grip strength as well. She now is reporting numbness extending into LLE for the last 3 months. She notices numbness in her L arm and LLE at the same time but is unsure if they are related. She feels tightness in the muscles of her neck. "It feels like if it would pop it would feel better." She reports that she hears some crunching and popping when she moves her head/neck. She [place] [facility] [place] and it helped with her migraines. No reported changes in symptoms in AM compared to PM. Pt reports occasionally dizzy but not presyncopal. She reports some night sweats. Pain wakes her up at night. Denies chills, fevers, loss of bowel/bladder control.    Diagnostic tests Arthritis in spine from cervical CT   Patient Stated Goals "It would be nice not to wake up at night with arms numb". Improve headaches. No functional limitations   Currently in Pain? Yes   Pain Score 2    Pain Location Neck   Pain Orientation Left;Lower   Pain Descriptors / Indicators Aching;Burning   Pain Type Chronic pain   Pain Radiating Towards Numbness in lateral forearm and thumb index finger   Pain Onset More than a month ago   Pain Frequency Intermittent   Aggravating Factors  Pt unable to say  Pain Relieving Factors Unable to say   Multiple Pain Sites No      Treatment:  Manual therapy: Re-assess response to repeated cervical retraction:  Assess Spurling/distraction: (+) bilaterally for increased N/T with Spurling, pain alleviated with distraction  Supine:  Assess soft tissue length: scalenes, upper traps, levator scapulae: pt with tightness in all listed muscle groups. Pt led in stretching using muscle energy technique for contract-relax stretch, 5 sec contract and 20 sec relax x 3 iterations each side Cervical rotation stretch using muscle energy technique for contract-relax stretch, 5 sec contract and 20 sec relax x 3  iterations each side Suboccipital release x 3-4 min with pt reporting no pain and partial resolution of headache  STM to upper trap, levator, and suboccipital musculature with multiple trigger points and increased muscle tightness bilaterally  HEP advanced with cervical stretches listed above with handout. Pt able to perform with good technique.        PT Education - 06/01/17 1757    Education provided Yes   Education Details HEP advance with self stretch for cervical muscles   Person(s) Educated Patient   Methods Explanation;Demonstration;Tactile cues;Verbal cues;Handout   Comprehension Tactile cues required;Verbal cues required;Returned demonstration;Verbalized understanding          PT Short Term Goals - 05/28/17 1110      PT SHORT TERM GOAL #1   Title Pt will be independent with HEP in order to decrease neck pain so she can improve pain-free function at home and work.   Time 3   Period Weeks   Status New   Target Date 06/18/17           PT Long Term Goals - 05/28/17 1111      PT LONG TERM GOAL #1   Title Pt will demonstrate decrease in NDI by at least 19% in order to demonstrate clinically significant reduction in disability related to neck injury/pain    Baseline 05/28/17: 22%   Time 6   Period Weeks   Status New     PT LONG TERM GOAL #2   Title Pt will decrease worst pain as reported on NPRS by at least 2 points in order to demonstrate clinically significant reduction in pain.   Baseline 05/28/17: worst 10/10   Time 6   Period Weeks   Status New   Target Date 07/09/17     PT LONG TERM GOAL #3   Title Pt will report no further numbness/tingling in LUE in order to improve her ability to sleep for a full night without waking up   Baseline 05/28/17: numbness/tingling wakes her up at night   Time 6   Period Weeks   Status New   Target Date 07/09/17     PT LONG TERM GOAL #4   Title Pt will report decrease in her headache frequency to once/week at most in  order to improve her ability to function at work   Baseline 05/28/17: every other day   Time 6   Period Weeks   Status New   Target Date 07/09/17             Plan - 06/01/17 1758    Clinical Impression Statement Pt presents with decreased radicular symptoms since last visit, though she continues to report bilateral UE numbness L>R. Pt is performing HEP including cervical retraction without issue and is sleeping better. Today she was further assessed and was found to have positive Spurling's bilaterally, though radicular symptoms were produced in the LUE  with Spurling's to the L and R. Symptoms were alleviated with cervical distraction.   Clinical Presentation Evolving   Clinical Decision Making Moderate   Rehab Potential Good   PT Frequency 2x / week   PT Duration 6 weeks   PT Treatment/Interventions ADLs/Self Care Home Management;Biofeedback;Cryotherapy;Electrical Stimulation;Iontophoresis 4mg /ml Dexamethasone;Moist Heat;Traction;Ultrasound;DME Instruction;Therapeutic activities;Therapeutic exercise;Neuromuscular re-education;Manual techniques;Patient/family education;Passive range of motion;Dry needling   PT Next Visit Plan Assess pt's response to cervical stretch HEP, re-instruct as needed. Consider dry needling upper trap trigger points vs STM, consider cervical traction, scalene stretch   PT Home Exercise Plan Cervical stretches for upper traps, levator, repeated cervical retractions (see instructions)   Consulted and Agree with Plan of Care Patient      Patient will benefit from skilled therapeutic intervention in order to improve the following deficits and impairments:  Impaired sensation, Pain, Decreased range of motion  Visit Diagnosis: Cervicalgia  Chronic nonintractable headache, unspecified headache type     Problem List Patient Active Problem List   Diagnosis Date Noted  . Abnormal breast finding 05/21/2017  . Cervical arthritis 05/11/2017  . Headache  09/27/2016  . DM type 2 (diabetes mellitus, type 2) (HCC) 10/01/2015  . Post-surgical hypothyroidism 10/01/2015  . Hypercholesteremia 10/01/2015  . Medication monitoring encounter 10/01/2015  . Need for hepatitis C screening test 10/01/2015  . Screening for HIV (human immunodeficiency virus) 10/01/2015    Susan Bates, SPT Cammie Mcgee, PT, DPT # [id number] 06/01/2017, 6:10 PM  [place] [facility] [address] [place], [place], [address] Phone: [phone]   Fax:  [phone]  Name: Susan Bates MRN: [id number] Date of Birth: Feb 02, 1956

## 2017-06-07 ENCOUNTER — Ambulatory Visit: Payer: BLUE CROSS/BLUE SHIELD | Admitting: Physical Therapy

## 2017-06-07 ENCOUNTER — Encounter: Payer: Self-pay | Admitting: Physical Therapy

## 2017-06-07 DIAGNOSIS — R51 Headache: Secondary | ICD-10-CM

## 2017-06-07 DIAGNOSIS — M542 Cervicalgia: Secondary | ICD-10-CM | POA: Diagnosis not present

## 2017-06-07 DIAGNOSIS — G8929 Other chronic pain: Secondary | ICD-10-CM

## 2017-06-07 NOTE — Therapy (Signed)
[place] [facility] [address] [place], [place], [address] Phone: [phone]   Fax:  [phone]  Physical Therapy Treatment  Patient Details  Name: Susan Bates MRN: [id number] Date of Birth: 12/29/1955 Referring Provider: Dr. Sherie DonLada  Encounter Date: 06/07/2017      PT End of Session - 06/07/17 1246    Visit Number 3   Number of Visits 13   Date for PT Re-Evaluation 07/09/17   PT Start Time 0943   PT Stop Time 1029   PT Time Calculation (min) 46 min   Equipment Utilized During Treatment Gait belt   Activity Tolerance Patient tolerated treatment well;No increased pain   Behavior During Therapy WFL for tasks assessed/performed      Past Medical History:  Diagnosis Date  . Anxiety   . Depression   . Diabetes mellitus without complication (HCC)   . High cholesterol   . Thyroid disease     Past Surgical History:  Procedure Laterality Date  . ABDOMINAL HYSTERECTOMY    . thyriodectomy    . TUBAL LIGATION      There were no vitals filed for this visit.      Subjective Assessment - 06/07/17 0944    Subjective Pt reports that she had increased soreness in her neck after doing neck stretches in therapy; the soreness lasted throughout the day and into the next morning. Pt currently has a headache.   Pertinent History Pt reports history of headache for multiple years. Headaches are several times a week approximately every other day. She has a history of migraines but hasn't had one in approximately 4 months. 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 She also has a history of neck pain for multiple years. She suffered a syncopal episode in 2007 with head trauma. She had a cervical spine CT scan at that time which revealed arthritis. Pt reports bilateral UE numbness L>R while she sleeps but occasionally during the day which has worsened over the last year. She states that the numbness extends down her  entire arms bilaterally. She has noticed decreased grip strength as well. She now is reporting numbness extending into LLE for the last 3 months. She notices numbness in her L arm and LLE at the same time but is unsure if they are related. She feels tightness in the muscles of her neck. "It feels like if it would pop it would feel better." She reports that she hears some crunching and popping when she moves her head/neck. [place] with her migraines. No reported changes in symptoms in AM compared to PM. Pt reports occasionally dizzy but not presyncopal. She reports some night sweats. Pain wakes her up at night. Denies chills, fevers, loss of bowel/bladder control.    Diagnostic tests Arthritis in spine from cervical CT   Patient Stated Goals "It would be nice not to wake up at night with arms numb". Improve headaches. No functional limitations   Pain Score 4    Pain Location Neck   Pain Orientation Upper   Pain Descriptors / Indicators Aching;Dull   Pain Type Acute pain   Pain Onset Today   Pain Frequency Intermittent   Aggravating Factors  Unknown   Pain Relieving Factors Excedrin      Treatment:   Manual therapy:    Supine:  Re-instruct stretching for levator, upper trap, cervical rotation and scalenes using  muscle energy technique for contract-relax stretch, 5 sec contract and 20 sec  relax x 3 iterations each side Suboccipital release, 2 x 4-5 min  Instructed pt in cervical traction with mobile unit; pt able to operate correctly and  safety with no cueing.   Manual cervical traction 2 x 3-4 min Sitting: STM to upper trap, levator, and suboccipital musculature          PT Education - 06/07/17 1245    Education provided Yes   Education Details Use of cervical traction    Person(s) Educated Patient   Methods Explanation;Demonstration;Tactile cues;Verbal cues   Comprehension Verbal cues required;Tactile  cues required;Returned demonstration;Verbalized understanding          PT Short Term Goals - 05/28/17 1110      PT SHORT TERM GOAL #1   Title Pt will be independent with HEP in order to decrease neck pain so she can improve pain-free function at home and work.   Time 3   Period Weeks   Status New   Target Date 06/18/17           PT Long Term Goals - 05/28/17 1111      PT LONG TERM GOAL #1   Title Pt will demonstrate decrease in NDI by at least 19% in order to demonstrate clinically significant reduction in disability related to neck injury/pain    Baseline 05/28/17: 22%   Time 6   Period Weeks   Status New     PT LONG TERM GOAL #2   Title Pt will decrease worst pain as reported on NPRS by at least 2 points in order to demonstrate clinically significant reduction in pain.   Baseline 05/28/17: worst 10/10   Time 6   Period Weeks   Status New   Target Date 07/09/17     PT LONG TERM GOAL #3   Title Pt will report no further numbness/tingling in LUE in order to improve her ability to sleep for a full night without waking up   Baseline 05/28/17: numbness/tingling wakes her up at night   Time 6   Period Weeks   Status New   Target Date 07/09/17     PT LONG TERM GOAL #4   Title Pt will report decrease in her headache frequency to once/week at most in order to improve her ability to function at work   Baseline 05/28/17: every other day   Time 6   Period Weeks   Status New   Target Date 07/09/17               Plan - 06/07/17 1246    Clinical Impression Statement Pt's response to repeated cervical retraction and cervical stretches was re-assessed today; pt had increased soreness following last session and did not do HEP other than cervical retraction. Pt had intermittent radicular symptoms in L upper arm during session today. Pt continues to have decreased radicular symptoms with cervical traction. Pt instructed on home cervical traction with unit issued for use.     Clinical Presentation Evolving   Clinical Decision Making Moderate   Rehab Potential Good   PT Frequency 2x / week   PT Duration 6 weeks   PT Treatment/Interventions ADLs/Self Care Home Management;Biofeedback;Cryotherapy;Electrical Stimulation;Iontophoresis 4mg /ml Dexamethasone;Moist Heat;Traction;Ultrasound;DME Instruction;Therapeutic activities;Therapeutic exercise;Neuromuscular re-education;Manual techniques;Patient/family education;Passive range of motion;Dry needling   PT Next Visit Plan Assess pt's response to cervical traction and stretch HEP, re-instruct as needed. Consider  dry needling upper trap trigger points vs STM, consider cervical traction, scalene stretch   PT Home Exercise Plan Cervical traction and stretches for upper traps, levator, repeated cervical retractions (see instructions)   Consulted and Agree with Plan of Care Patient      Patient will benefit from skilled therapeutic intervention in order to improve the following deficits and impairments:  Impaired sensation, Pain, Decreased range of motion  Visit Diagnosis: Cervicalgia  Chronic nonintractable headache, unspecified headache type     Problem List Patient Active Problem List   Diagnosis Date Noted  . Abnormal breast finding 05/21/2017  . Cervical arthritis 05/11/2017  . Headache 09/27/2016  . DM type 2 (diabetes mellitus, type 2) (HCC) 10/01/2015  . Post-surgical hypothyroidism 10/01/2015  . Hypercholesteremia 10/01/2015  . Medication monitoring encounter 10/01/2015  . Need for hepatitis C screening test 10/01/2015  . Screening for HIV (human immunodeficiency virus) 10/01/2015   Cassell Smiles, SPT  06/07/2017, 12:53 PM   This entire session was performed under direct supervision and direction of a licensed therapist/therapist assistant . I have personally read, edited and approve of the note as written. Encarnacion Chu PT, DPT  [place] [facility] [address] [place], [place], [address] Phone: [phone]   Fax:  [phone]  Name: Susan Bates MRN: [id number] Date of Birth: Jul 01, 1956

## 2017-06-11 ENCOUNTER — Encounter: Payer: Self-pay | Admitting: Physical Therapy

## 2017-06-11 ENCOUNTER — Ambulatory Visit: Payer: BLUE CROSS/BLUE SHIELD | Admitting: Physical Therapy

## 2017-06-11 DIAGNOSIS — M542 Cervicalgia: Secondary | ICD-10-CM

## 2017-06-11 DIAGNOSIS — G8929 Other chronic pain: Secondary | ICD-10-CM

## 2017-06-11 DIAGNOSIS — R51 Headache: Secondary | ICD-10-CM

## 2017-06-11 NOTE — Therapy (Signed)
[place] [facility] [address] [place], [place], [address] Phone: [phone]   Fax:  [phone]  Physical Therapy Treatment  Patient Details  Name: Susan Bates MRN: [id number] Date of Birth: [birth year] Referring Provider: Dr. Sherie Don  Encounter Date: 06/11/2017      PT End of Session - 06/11/17 0856    Visit Number 4   Number of Visits 13   Date for PT Re-Evaluation 07/09/17   PT Start Time 0802   PT Stop Time 0901   PT Time Calculation (min) 59 min   Equipment Utilized During Treatment Gait belt   Activity Tolerance Patient tolerated treatment well;No increased pain   Behavior During Therapy WFL for tasks assessed/performed      Past Medical History:  Diagnosis Date  . Anxiety   . Depression   . Diabetes mellitus without complication (HCC)   . High cholesterol   . Thyroid disease     Past Surgical History:  Procedure Laterality Date  . ABDOMINAL HYSTERECTOMY    . thyriodectomy    . TUBAL LIGATION      There were no vitals filed for this visit.      Subjective Assessment - 06/11/17 0804    Subjective Pt reports that she has increased tingling in L shoulder today, but no pain. Her headache has been better.   Pertinent History Pt reports history of headache for multiple years. Headaches are several times a week approximately every other day. She has a history of migraines but hasn't had one in approximately 4 months. 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 She also has a history of neck pain for multiple years. She suffered a syncopal episode in 2007 with head trauma. She had a cervical spine CT scan at that time which revealed arthritis. Pt reports bilateral UE numbness L>R while she sleeps but occasionally during the day which has worsened over the last year. She states that the numbness extends down her entire arms bilaterally. She has noticed decreased grip strength as well. She now is  reporting numbness extending into LLE for the last 3 months. She notices numbness in her L arm and LLE at the same time but is unsure if they are related. She feels tightness in the muscles of her neck. "It feels like if it would pop it would feel better." She reports that she hears some crunching and popping when she moves her head/neck. [place] has [place] and it helped with her migraines. No reported changes in symptoms in AM compared to PM. Pt reports occasionally dizzy but not presyncopal. She reports some night sweats. Pain wakes her up at night. Denies chills, fevers, loss of bowel/bladder control.    Diagnostic tests Arthritis in spine from cervical CT   Patient Stated Goals "It would be nice not to wake up at night with arms numb". Improve headaches. No functional limitations   Currently in Pain? No/denies   Pain Onset Today     Treatment:   Manual therapy:   Supine:             Stretching for levator, upper trap, cervical rotation and scalenes using muscle energy technique for contract-relax stretch, 5 sec contract and 20 sec relax x 3 iterations each side  Suboccipital release/Manual cervical traction x 4-5 min throughout stretching              Re-instructed  pt in cervical traction with mobile unit; pt able to operate correctly and safety with min cueing for setup  Prone:  Assess thoracic/cervical hypomobility; hypomobility noted T1-T6; mobs grade III-IV 2 x 30 sec bouts each level; pt reports no pain  Ther-ex: Sitting: Thoracic repeated extension 2 x 10 Standing:  Scapular retraction resisted with green t-band 2 x 20  Shoulder external rotations BUE 2 x 20 with green t-band  HEP updated with the the Ther-ex above. See pt instructions  Cervical traction x 12 min gradually increased to 18# in supine; no pain or discomfort reported.         PT Education - 06/11/17 0856    Education provided Yes   Education Details Ther-ex, HEP  updated   Person(s) Educated Patient   Methods Explanation;Verbal cues;Handout;Tactile cues;Demonstration   Comprehension Verbalized understanding;Returned demonstration;Verbal cues required;Tactile cues required          PT Short Term Goals - 05/28/17 1110      PT SHORT TERM GOAL #1   Title Pt will be independent with HEP in order to decrease neck pain so she can improve pain-free function at home and work.   Time 3   Period Weeks   Status New   Target Date 06/18/17           PT Long Term Goals - 05/28/17 1111      PT LONG TERM GOAL #1   Title Pt will demonstrate decrease in NDI by at least 19% in order to demonstrate clinically significant reduction in disability related to neck injury/pain    Baseline 05/28/17: 22%   Time 6   Period Weeks   Status New     PT LONG TERM GOAL #2   Title Pt will decrease worst pain as reported on NPRS by at least 2 points in order to demonstrate clinically significant reduction in pain.   Baseline 05/28/17: worst 10/10   Time 6   Period Weeks   Status New   Target Date 07/09/17     PT LONG TERM GOAL #3   Title Pt will report no further numbness/tingling in LUE in order to improve her ability to sleep for a full night without waking up   Baseline 05/28/17: numbness/tingling wakes her up at night   Time 6   Period Weeks   Status New   Target Date 07/09/17     PT LONG TERM GOAL #4   Title Pt will report decrease in her headache frequency to once/week at most in order to improve her ability to function at work   Baseline 05/28/17: every other day   Time 6   Period Weeks   Status New   Target Date 07/09/17             Plan - 06/11/17 1829    Clinical Impression Statement Pt reports she has been performing cervical traction HEP and cervical stretching almost every day; she has had no increased soreness with stretching and no pain or issue with cervical traction. She reports she Is unsure if traction is improving her symptoms,  but states that traction is not making her symptoms worse. Pt had decreased radicular symptoms in LUE during and after traction today. Pt is willing to consider dry needling to treat upper trap trigger points; will discuss further next session.   Clinical Presentation Evolving   Clinical Decision Making Moderate   Rehab Potential Good   PT Frequency 2x / week   PT Duration 6 weeks  PT Treatment/Interventions ADLs/Self Care Home Management;Biofeedback;Cryotherapy;Electrical Stimulation;Iontophoresis 4mg /ml Dexamethasone;Moist Heat;Traction;Ultrasound;DME Instruction;Therapeutic activities;Therapeutic exercise;Neuromuscular re-education;Manual techniques;Patient/family education;Passive range of motion;Dry needling   PT Next Visit Plan Continue cervical traction, re-instruct as needed. Consider dry needling upper trap trigger points vs STM, consider cervical traction, scalene stretch   PT Home Exercise Plan Cervical traction and stretches for upper traps, levator, repeated cervical retractions (see instructions)   Consulted and Agree with Plan of Care Patient      Patient will benefit from skilled therapeutic intervention in order to improve the following deficits and impairments:  Impaired sensation, Pain, Decreased range of motion  Visit Diagnosis: Cervicalgia  Chronic nonintractable headache, unspecified headache type     Problem List Patient Active Problem List   Diagnosis Date Noted  . Abnormal breast finding 05/21/2017  . Cervical arthritis 05/11/2017  . Headache 09/27/2016  . DM type 2 (diabetes mellitus, type 2) (HCC) 10/01/2015  . Post-surgical hypothyroidism 10/01/2015  . Hypercholesteremia 10/01/2015  . Medication monitoring encounter 10/01/2015  . Need for hepatitis C screening test 10/01/2015  . Screening for HIV (human immunodeficiency virus) 10/01/2015   Jeraldin Fesler Clydene Laming, SPT Cammie Mcgee, PT, DPT # [phone] 06/12/2017, 7:51 AM  [place] [facility] [address] [place], [place], [address] Phone: [phone]   Fax:  [phone]  Name: Susan Bates MRN: [id number] Date of Birth: Jul 20, 1956

## 2017-06-13 ENCOUNTER — Encounter: Payer: BLUE CROSS/BLUE SHIELD | Admitting: Physical Therapy

## 2017-06-18 ENCOUNTER — Ambulatory Visit: Payer: BLUE CROSS/BLUE SHIELD | Attending: Family Medicine | Admitting: Physical Therapy

## 2017-06-18 ENCOUNTER — Encounter: Payer: Self-pay | Admitting: Physical Therapy

## 2017-06-18 DIAGNOSIS — G8929 Other chronic pain: Secondary | ICD-10-CM

## 2017-06-18 DIAGNOSIS — M542 Cervicalgia: Secondary | ICD-10-CM | POA: Diagnosis present

## 2017-06-18 DIAGNOSIS — R51 Headache: Secondary | ICD-10-CM | POA: Diagnosis present

## 2017-06-18 NOTE — Therapy (Signed)
[place] [facility] [address] [place], [place], [address] Phone: [phone]   Fax:  [phone]  Physical Therapy Treatment  Patient Details  Name: Susan Bates MRN: [id number] Date of Birth: 12-12-1955 Referring Provider: Dr. Sherie DonLada   Encounter Date: 06/18/2017  PT End of Session - 06/18/17 1226    Visit Number  5    Number of Visits  13    Date for PT Re-Evaluation  07/09/17    PT Start Time  0813    PT Stop Time  0907    PT Time Calculation (min)  54 min    Equipment Utilized During Treatment  Gait belt    Activity Tolerance  Patient tolerated treatment well;No increased pain    Behavior During Therapy  WFL for tasks assessed/performed       Past Medical History:  Diagnosis Date  . Anxiety   . Depression   . Diabetes mellitus without complication (HCC)   . High cholesterol   . Thyroid disease     Past Surgical History:  Procedure Laterality Date  . ABDOMINAL HYSTERECTOMY    . thyriodectomy    . TUBAL LIGATION      There were no vitals filed for this visit.  Subjective Assessment - 06/18/17 0818    Subjective  Pt reports her symptoms may be a little better. She currently has no headache or radicular symptoms.     Pertinent History  Pt reports history of headache for multiple years. Headaches are several times a week approximately every other day. She has a history of migraines but hasn't had one in approximately 4 months. 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 She also has a history of neck pain for multiple years. She suffered a syncopal episode in 2007 with head trauma. She had a cervical spine CT scan at that time which revealed arthritis. Pt reports bilateral UE numbness L>R while she sleeps but occasionally during the day which has worsened over the last year. She states that the numbness extends down her entire arms bilaterally. She has noticed decreased grip strength as well. She  now is reporting numbness extending into LLE for the last 3 months. She notices numbness in her L arm and LLE at the same time but is unsure if they are related. She feels tightness in the muscles of her neck. "It feels like if it would pop it would feel better." She reports that she hears some crunching and popping when she moves her head/neck. She has been [place] [facility] [place] and it helped with her migraines. No reported changes in symptoms in AM compared to PM. Pt reports occasionally dizzy but not presyncopal. She reports some night sweats. Pain wakes her up at night. Denies chills, fevers, loss of bowel/bladder control.     Diagnostic tests  Arthritis in spine from cervical CT    Patient Stated Goals  "It would be nice not to wake up at night with arms numb". Improve headaches. No functional limitations    Currently in Pain?  No/denies    Pain Onset  Today      Treatment:   Manual therapy:   Supine:             Stretching for levator, upper trap, cervical rotation and scalenes using muscle energy technique for contract-relax stretch, 5 sec contract and 20 sec relax x 3 iterations each side  Mobs 2 x 30 sec bouts grade III-IV to each 1st rib with min hypomobility noted; multiple trigger points in L upper trap noted Manual cervical traction x 4-5 min throughout session   Prone:      Dry needling Upper trap (2 needles each side with increased tenderness noted on L).  Several muscle fasciculations noted with dry needling, esp. In L UT trigger point region.   Pt instructed to ice upper traps at home x 20 min.       STM to upper traps, paraspinals (generalized) in prone position.    Ther-ex:  Sitting: Nerve glide re-instructed x 20 with tactile cues/demonstration; instructed pt to perform in pain-free range.  Self stretch for scalenes, upper traps, and levator re-instructed x 6-7 min       PT Education - 06/18/17 0832    Education provided  Yes     Education Details  Ther-ex, HEP advanced    Person(s) Educated  Patient    Methods  Explanation;Demonstration;Tactile cues;Verbal cues;Handout    Comprehension  Verbalized understanding;Returned demonstration;Verbal cues required;Tactile cues required       PT Short Term Goals - 05/28/17 1110      PT SHORT TERM GOAL #1   Title  Pt will be independent with HEP in order to decrease neck pain so she can improve pain-free function at home and work.    Time  3    Period  Weeks    Status  New    Target Date  06/18/17        PT Long Term Goals - 05/28/17 1111      PT LONG TERM GOAL #1   Title  Pt will demonstrate decrease in NDI by at least 19% in order to demonstrate clinically significant reduction in disability related to neck injury/pain     Baseline  05/28/17: 22%    Time  6    Period  Weeks    Status  New      PT LONG TERM GOAL #2   Title  Pt will decrease worst pain as reported on NPRS by at least 2 points in order to demonstrate clinically significant reduction in pain.    Baseline  05/28/17: worst 10/10    Time  6    Period  Weeks    Status  New    Target Date  07/09/17      PT LONG TERM GOAL #3   Title  Pt will report no further numbness/tingling in LUE in order to improve her ability to sleep for a full night without waking up    Baseline  05/28/17: numbness/tingling wakes her up at night    Time  6    Period  Weeks    Status  New    Target Date  07/09/17      PT LONG TERM GOAL #4   Title  Pt will report decrease in her headache frequency to once/week at most in order to improve her ability to function at work    Baseline  05/28/17: every other day    Time  6    Period  Weeks    Status  New    Target Date  07/09/17            Plan - 06/18/17 1241    Clinical Impression Statement   Pt presents to therapy with reduced symptoms today. She reports that she has been traveling since last visit and has not been able to perform most  of her HEP. She did perform  cervical traction last night and reports that traction made her neck feel better. Dry needling was performed today to multiple trigger points in upper trap (2 needles each side with increased tenderness noted on L). Pt instructed to ice upper traps at home x 20 min.     Clinical Presentation  Evolving    Clinical Decision Making  Moderate    Rehab Potential  Good    PT Frequency  2x / week    PT Duration  6 weeks    PT Treatment/Interventions  ADLs/Self Care Home Management;Biofeedback;Cryotherapy;Electrical Stimulation;Iontophoresis 4mg /ml Dexamethasone;Moist Heat;Traction;Ultrasound;DME Instruction;Therapeutic activities;Therapeutic exercise;Neuromuscular re-education;Manual techniques;Patient/family education;Passive range of motion;Dry needling    PT Next Visit Plan  Continue cervical traction, dry needling upper trap trigger points     PT Home Exercise Plan  Added nerve glide in sitting, Continued Cervical traction and stretches for upper traps, levator, repeated cervical retractions (see instructions)    Consulted and Agree with Plan of Care  Patient       Patient will benefit from skilled therapeutic intervention in order to improve the following deficits and impairments:  Impaired sensation, Pain, Decreased range of motion  Visit Diagnosis: Cervicalgia  Chronic nonintractable headache, unspecified headache type     Problem List Patient Active Problem List   Diagnosis Date Noted  . Abnormal breast finding 05/21/2017  . Cervical arthritis 05/11/2017  . Headache 09/27/2016  . DM type 2 (diabetes mellitus, type 2) (HCC) 10/01/2015  . Post-surgical hypothyroidism 10/01/2015  . Hypercholesteremia 10/01/2015  . Medication monitoring encounter 10/01/2015  . Need for hepatitis C screening test 10/01/2015  . Screening for HIV (human immunodeficiency virus) 10/01/2015   Seana Underwood Clydene Laming, SPT Cammie Mcgee, PT, DPT # [phone] 06/18/2017, 1:15 PM  [place] [facility] [address] [place], [place], [address] Phone: [phone]   Fax:  [phone]  Name: CARLENE BICKLEY MRN: [id number] Date of Birth: June 23, 1956

## 2017-06-21 ENCOUNTER — Encounter: Payer: Self-pay | Admitting: Physical Therapy

## 2017-06-21 ENCOUNTER — Ambulatory Visit: Payer: BLUE CROSS/BLUE SHIELD | Admitting: Physical Therapy

## 2017-06-21 DIAGNOSIS — M542 Cervicalgia: Secondary | ICD-10-CM | POA: Diagnosis not present

## 2017-06-21 DIAGNOSIS — R51 Headache: Secondary | ICD-10-CM

## 2017-06-21 DIAGNOSIS — G8929 Other chronic pain: Secondary | ICD-10-CM

## 2017-06-21 NOTE — Therapy (Signed)
[facility] [address] [place], [place], [address] Phone: [phone]   Fax:  [phone]  Physical Therapy Treatment  Patient Details  Name: Susan Bates MRN: [id number] Date of Birth: [birth year] Referring Provider: Dr. Sherie DonLada   Encounter Date: 06/21/2017  PT End of Session - 06/21/17 1343    Visit Number  6    Number of Visits  13    Date for PT Re-Evaluation  07/09/17    PT Start Time  1301    PT Stop Time  1413    PT Time Calculation (min)  72 min    Equipment Utilized During Treatment  Gait belt    Activity Tolerance  Patient tolerated treatment well;No increased pain    Behavior During Therapy  WFL for tasks assessed/performed       Past Medical History:  Diagnosis Date  . Anxiety   . Depression   . Diabetes mellitus without complication (HCC)   . High cholesterol   . Thyroid disease     Past Surgical History:  Procedure Laterality Date  . ABDOMINAL HYSTERECTOMY    . thyriodectomy    . TUBAL LIGATION      There were no vitals filed for this visit.  Subjective Assessment - 06/21/17 1258    Subjective  Pt reports that she had soreness after dry needling last session. Pt has not had a headache for 2 days now and has not had any numbness and tingling today. No pain at this time.    Pertinent History  Pt reports history of headache for multiple years. Headaches are several times a week approximately every other day. She has a history of migraines but hasn't had one in approximately 4 months. 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 She also has a history of neck pain for multiple years. She suffered a syncopal episode in 2007 with head trauma. She had a cervical spine CT scan at that time which revealed arthritis. Pt reports bilateral UE numbness L>R while she sleeps but occasionally during the day which has worsened over the last year. She states that the numbness extends down her  entire arms bilaterally. She has noticed decreased grip strength as well. She now is reporting numbness extending into LLE for the last 3 months. She notices numbness in her L arm and LLE at the same time but is unsure if they are related. She feels tightness in the muscles of her neck. "It feels like if it would pop it would feel better." She reports that she hears some crunching and popping when she moves her head/neck. [place] [facility] with [place] migraines. No reported changes in symptoms in AM compared to PM. Pt reports occasionally dizzy but not presyncopal. She reports some night sweats. Pain wakes her up at night. Denies chills, fevers, loss of bowel/bladder control.     Diagnostic tests  Arthritis in spine from cervical CT    Patient Stated Goals  "It would be nice not to wake up at night with arms numb". Improve headaches. No functional limitations    Currently in Pain?  No/denies    Pain Onset  Today       Treatment:   Manual therapy:   Supine: Stretching for pec minor with active release (shoulder maximally elevated in scaption) x 4-5 min per side with radicular symptoms reproduced  Stretching using  muscle energy technique for contract-relax stretch, 5 sec contract and 20 sec relax x 3 iterations each side 1st rib mobs grade III 3 x 30 sec each with increased radicular symptoms in R arm with mobs on R Prone: Dry needling Upper trap 2 needles each side (trigger points noted bilaterally)           STM to upper traps, paraspinals (generalized) in prone position     Subscapular release with tightness noted bilaterally, no pain.    Mobs to T4-upper cervical spine grade III-IV 2 x 30 sec each level with hypomobility       throughout thoracic spine with minimal tenderness.   Ice applied to upper traps at end of session x 15 min (no charge)    PT Short Term Goals - 05/28/17 1110      PT SHORT TERM GOAL #1   Title  Pt will be  independent with HEP in order to decrease neck pain so she can improve pain-free function at home and work.    Time  3    Period  Weeks    Status  New    Target Date  06/18/17        PT Long Term Goals - 05/28/17 1111      PT LONG TERM GOAL #1   Title  Pt will demonstrate decrease in NDI by at least 19% in order to demonstrate clinically significant reduction in disability related to neck injury/pain     Baseline  05/28/17: 22%    Time  6    Period  Weeks    Status  New      PT LONG TERM GOAL #2   Title  Pt will decrease worst pain as reported on NPRS by at least 2 points in order to demonstrate clinically significant reduction in pain.    Baseline  05/28/17: worst 10/10    Time  6    Period  Weeks    Status  New    Target Date  07/09/17      PT LONG TERM GOAL #3   Title  Pt will report no further numbness/tingling in LUE in order to improve her ability to sleep for a full night without waking up    Baseline  05/28/17: numbness/tingling wakes her up at night    Time  6    Period  Weeks    Status  New    Target Date  07/09/17      PT LONG TERM GOAL #4   Title  Pt will report decrease in her headache frequency to once/week at most in order to improve her ability to function at work    Baseline  05/28/17: every other day    Time  6    Period  Weeks    Status  New    Target Date  07/09/17            Plan - 06/21/17 1358    Clinical Impression Statement  Pt responded well to dry needling following last session; she had mild soreness that has persisted, but pt has no has any headache in the last 2 days (she usually has headaches almost everyday). No neck pain during session today. Some transient radicular symptoms were reproduced with pec minor active release stretching and first rib mobilization today. Mid and upper thoracic spine hypomobile with central PAs. Multiple trigger points noted in upper traps.    Clinical Presentation  Evolving    Clinical Decision Making  Moderate    Rehab Potential  Good    PT Frequency  2x / week    PT Duration  6 weeks    PT Treatment/Interventions  ADLs/Self Care Home Management;Biofeedback;Cryotherapy;Electrical Stimulation;Iontophoresis 4mg /ml Dexamethasone;Moist Heat;Traction;Ultrasound;DME Instruction;Therapeutic activities;Therapeutic exercise;Neuromuscular re-education;Manual techniques;Patient/family education;Passive range of motion;Dry needling    PT Next Visit Plan  Continue cervical traction, dry needling upper trap trigger points, 1st rib mobs, subscap release, pec minor active release    PT Home Exercise Plan  Continued nerve glide in sitting, Cervical traction and stretches for upper traps, levator, repeated cervical retractions (see instructions)    Consulted and Agree with Plan of Care  Patient       Patient will benefit from skilled therapeutic intervention in order to improve the following deficits and impairments:  Impaired sensation, Pain, Decreased range of motion  Visit Diagnosis: Cervicalgia  Chronic nonintractable headache, unspecified headache type     Problem List Patient Active Problem List   Diagnosis Date Noted  . Abnormal breast finding 05/21/2017  . Cervical arthritis 05/11/2017  . Headache 09/27/2016  . DM type 2 (diabetes mellitus, type 2) (HCC) 10/01/2015  . Post-surgical hypothyroidism 10/01/2015  . Hypercholesteremia 10/01/2015  . Medication monitoring encounter 10/01/2015  . Need for hepatitis C screening test 10/01/2015  . Screening for HIV (human immunodeficiency virus) 10/01/2015   Dezmin Kittelson Clydene Laming, SPT Cammie Mcgee, PT, DPT # [id number] 06/22/2017, 2:26 PM  [place] [facility] [address] [place], [place], [address] Phone: [phone]   Fax:  [phone]  Name: Susan Bates MRN: [id number] Date of Birth: 08/08/1956

## 2017-06-25 ENCOUNTER — Ambulatory Visit: Payer: BLUE CROSS/BLUE SHIELD | Admitting: Physical Therapy

## 2017-06-25 ENCOUNTER — Encounter: Payer: Self-pay | Admitting: Physical Therapy

## 2017-06-25 DIAGNOSIS — M542 Cervicalgia: Secondary | ICD-10-CM | POA: Diagnosis not present

## 2017-06-25 DIAGNOSIS — G8929 Other chronic pain: Secondary | ICD-10-CM

## 2017-06-25 DIAGNOSIS — R51 Headache: Secondary | ICD-10-CM

## 2017-06-25 NOTE — Therapy (Signed)
[facility] [address] [place], [place], [address] Phone: [phone]   Fax:  [phone]  Physical Therapy Treatment  Patient Details  Name: Susan Bates MRN: [id number] Date of Birth: Feb 20, 1956 Referring Provider: Dr. Sherie Don   Encounter Date: 06/25/2017  PT End of Session - 06/25/17 1644    Visit Number  7    Number of Visits  13    Date for PT Re-Evaluation  07/09/17    PT Start Time  0901    PT Stop Time  0946    PT Time Calculation (min)  45 min    Equipment Utilized During Treatment  Gait belt    Activity Tolerance  Patient tolerated treatment well;No increased pain    Behavior During Therapy  WFL for tasks assessed/performed       Past Medical History:  Diagnosis Date  . Anxiety   . Depression   . Diabetes mellitus without complication (HCC)   . High cholesterol   . Thyroid disease     Past Surgical History:  Procedure Laterality Date  . ABDOMINAL HYSTERECTOMY    . thyriodectomy    . TUBAL LIGATION      There were no vitals filed for this visit.  Subjective Assessment - 06/25/17 0910    Subjective  Pt reports she had soreness following needling for several days after last session. No pain at beginning of session today.    Pertinent History  Pt reports history of headache for multiple years. Headaches are several times a week approximately every other day. She has a history of migraines but hasn't had one in approximately 4 months. 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 She also has a history of neck pain for multiple years. She suffered a syncopal episode in 2007 with head trauma. She had a cervical spine CT scan at that time which revealed arthritis. Pt reports bilateral UE numbness L>R while she sleeps but occasionally during the day which has worsened over the last year. She states that the numbness extends down her entire arms bilaterally. She has noticed decreased grip  strength as well. She now is reporting numbness extending into LLE for the last 3 months. She notices numbness in her L arm and LLE at the same time but is unsure if they are related. She feels tightness in the muscles of her neck. "It feels like if it would pop it would feel better." She reports that she hears some crunching and popping when she moves her head/neck. [place] and it helped with her migraines. No reported changes in symptoms in AM compared to PM. Pt reports occasionally dizzy but not presyncopal. She reports some night sweats. Pain wakes her up at night. Denies chills, fevers, loss of bowel/bladder control.     Diagnostic tests  Arthritis in spine from cervical CT    Patient Stated Goals  "It would be nice not to wake up at night with arms numb". Improve headaches. No functional limitations    Currently in Pain?  No/denies    Pain Onset  Today         Treatment:   Manual therapy:   Supine:    Cervical stretching including rotation, UT, scalene, levator 2 x 30 sec each      UE AROM and cervical AROM screened and found to be [facility] with no pain Stretching for pec  minor using muscle energy technique for contract-relax stretch, 5 sec contract and 20 sec relax x 3 iterations each side Prone:    Central PAs and unilateral mobs bilaterally to T4-upper cervical spine grade III-IV 2 x 30 sec each level with hypomobility throughout thoracic spine     STM to upper traps, paraspinals (generalized) in prone position             Sitting:   STM to upper traps and interscapular musculature, multiple trigger points noted in upper traps; compression release performed (pt with tenderness up to 5/10); no pain following STM.     PT Education - 06/25/17 1644    Education provided  Yes    Education Details  HEP discussed, Customer service managericing     Person(s) Educated  Patient    Methods  Explanation;Demonstration;Tactile cues;Verbal cues    Comprehension   Verbal cues required;Tactile cues required;Returned demonstration;Verbalized understanding       PT Short Term Goals - 05/28/17 1110      PT SHORT TERM GOAL #1   Title  Pt will be independent with HEP in order to decrease neck pain so she can improve pain-free function at home and work.    Time  3    Period  Weeks    Status  New    Target Date  06/18/17        PT Long Term Goals - 05/28/17 1111      PT LONG TERM GOAL #1   Title  Pt will demonstrate decrease in NDI by at least 19% in order to demonstrate clinically significant reduction in disability related to neck injury/pain     Baseline  05/28/17: 22%    Time  6    Period  Weeks    Status  New      PT LONG TERM GOAL #2   Title  Pt will decrease worst pain as reported on NPRS by at least 2 points in order to demonstrate clinically significant reduction in pain.    Baseline  05/28/17: worst 10/10    Time  6    Period  Weeks    Status  New    Target Date  07/09/17      PT LONG TERM GOAL #3   Title  Pt will report no further numbness/tingling in LUE in order to improve her ability to sleep for a full night without waking up    Baseline  05/28/17: numbness/tingling wakes her up at night    Time  6    Period  Weeks    Status  New    Target Date  07/09/17      PT LONG TERM GOAL #4   Title  Pt will report decrease in her headache frequency to once/week at most in order to improve her ability to function at work    Baseline  05/28/17: every other day    Time  6    Period  Weeks    Status  New    Target Date  07/09/17         Plan - 06/25/17 1656    Clinical Impression Statement  Pt continues to improve with only one minor headache since last session. Pt reports decreased headache frequency and intensity over the last 2 weeks. She does have prolonged soreness following dry needling treatment. Pt has improved cervical AROM with gross assessment; no pain at end range. Pt has has decreased radicular symptoms over the past  1-2 weeks.  Clinical Presentation  Evolving    Clinical Decision Making  Moderate    Rehab Potential  Good    PT Frequency  2x / week    PT Duration  6 weeks    PT Treatment/Interventions  ADLs/Self Care Home Management;Biofeedback;Cryotherapy;Electrical Stimulation;Iontophoresis 4mg /ml Dexamethasone;Moist Heat;Traction;Ultrasound;DME Instruction;Therapeutic activities;Therapeutic exercise;Neuromuscular re-education;Manual techniques;Patient/family education;Passive range of motion;Dry needling    PT Next Visit Plan  Continue STM, cervical traction, dry needling upper trap trigger points, 1st rib mobs, subscap release, pec minor active release    PT Home Exercise Plan  Continued nerve glide in sitting, Cervical traction and stretches for upper traps, levator, repeated cervical retractions (see instructions)    Consulted and Agree with Plan of Care  Patient       Patient will benefit from skilled therapeutic intervention in order to improve the following deficits and impairments:  Impaired sensation, Pain, Decreased range of motion  Visit Diagnosis: Cervicalgia  Chronic nonintractable headache, unspecified headache type     Problem List Patient Active Problem List   Diagnosis Date Noted  . Abnormal breast finding 05/21/2017  . Cervical arthritis 05/11/2017  . Headache 09/27/2016  . DM type 2 (diabetes mellitus, type 2) (HCC) 10/01/2015  . Post-surgical hypothyroidism 10/01/2015  . Hypercholesteremia 10/01/2015  . Medication monitoring encounter 10/01/2015  . Need for hepatitis C screening test 10/01/2015  . Screening for HIV (human immunodeficiency virus) 10/01/2015   Aleena Kirkeby Clydene LamingM Jacarie Pate, SPT Cammie McgeeMichael C Sherk, PT, DPT # [id number] [phone] 06/25/2017, 5:20 PM  [place] [facility] [address] [place], [place], [address] Phone: [phone]   Fax:  [phone]  Name: Susan Bates MRN: [id number] Date of Birth: 07-12-1956

## 2017-06-28 ENCOUNTER — Ambulatory Visit: Payer: BLUE CROSS/BLUE SHIELD | Admitting: Physical Therapy

## 2017-06-28 ENCOUNTER — Encounter: Payer: Self-pay | Admitting: Physical Therapy

## 2017-06-28 DIAGNOSIS — G8929 Other chronic pain: Secondary | ICD-10-CM

## 2017-06-28 DIAGNOSIS — M542 Cervicalgia: Secondary | ICD-10-CM | POA: Diagnosis not present

## 2017-06-28 DIAGNOSIS — R51 Headache: Secondary | ICD-10-CM

## 2017-06-28 NOTE — Therapy (Signed)
[facility] [address] [place], [place], [address] Phone: [phone]   Fax:  [phone]  Physical Therapy Treatment  Patient Details  Name: Susan Bates MRN: [id number] Date of Birth: 1956/03/17 Referring Provider: Dr. Sanda Klein   Encounter Date: 06/28/2017  PT End of Session - 06/28/17 1824    Visit Number  8    Number of Visits  13    Date for PT Re-Evaluation  07/09/17    PT Start Time  0841    PT Stop Time  0928    PT Time Calculation (min)  47 min    Equipment Utilized During Treatment  Gait belt    Activity Tolerance  Patient tolerated treatment well;No increased pain    Behavior During Therapy  WFL for tasks assessed/performed       Past Medical History:  Diagnosis Date  . Anxiety   . Depression   . Diabetes mellitus without complication ([place])   . High cholesterol   . Thyroid disease     Past Surgical History:  Procedure Laterality Date  . ABDOMINAL HYSTERECTOMY    . thyriodectomy    . TUBAL LIGATION      There were no vitals filed for this visit.  Subjective Assessment - 06/28/17 0834    Subjective  Pt had massage on Tuesday and had some soreness that lasted until today. No pain currently other than minor headache.     Pertinent History  Pt reports history of headache for multiple years. Headaches are several times a week approximately every other day. She has a history of migraines but hasn't had one in approximately 4 months. 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 She also has a history of neck pain for multiple years. She suffered a syncopal episode in 2007 with head trauma. She had a cervical spine CT scan at that time which revealed arthritis. Pt reports bilateral UE numbness L>R while she sleeps but occasionally during the day which has worsened over the last year. She states that the numbness extends down her entire arms bilaterally. She has noticed decreased grip  strength as well. She now is reporting numbness extending into LLE for the last 3 months. She notices numbness in her L arm and LLE at the same time but is unsure if they are related. She feels tightness in the muscles of her neck. "It feels like if it would pop it would feel better." She reports that she hears some crunching and popping when she moves her head/neck. She has been to a Restaurant manager, fast food in the past (2007) in [place] and it helped with her migraines. No reported changes in symptoms in AM compared to PM. Pt reports occasionally dizzy but not presyncopal. She reports some night sweats. Pain wakes her up at night. Denies chills, fevers, loss of bowel/bladder control.     Diagnostic tests  Arthritis in spine from cervical CT    Patient Stated Goals  "It would be nice not to wake up at night with arms numb". Improve headaches. No functional limitations    Currently in Pain?  No/denies    Pain Onset  Today         Treatment:   Goals Updated:    NDI 14% disability  (previously 22% on 10/15)    Pain goal ongoing with significant improvement     Headaches improving  Manual therapy:   Supine:  Cervical stretching including rotation, UT, scalene, levator 2 x 30 sec each           UE AROM and cervical AROM screened and found to be [facility] with no pain Stretching for pec minor using muscle energy technique for contract-relax stretch, 5 sec contract and 20 sec relax x 3 iterations each side Prone:            Central PAs and unilateral mobs bilaterally to T4-upper cervical spine grade III-IV 2 x 30 sec each level with hypomobility throughout thoracic spine.     Transverse mobs grade III to C3-6 with reproduction of pt's radicular symptoms with R transverse mos at C5,6        STM to upper traps, paraspinals (generalized) in prone position             Sitting:             STM to upper traps and interscapular musculature, multiple trigger points noted in upper traps; compression release  performed (pt with tenderness up to 5/10); no pain following STM.  Ther-ex: Standing:   Lat pull-down with GTB x 20 reps   Sitting:  Repeated thoracic extension x 10 reps with 5-10 sec hold.    Added thoracic extension, lat pull-down to HEP with printed instructions, see pt instructions     PT Education - 06/28/17 1823    Education provided  Yes    Education Details  HEP updated    Person(s) Educated  Patient    Methods  Explanation;Demonstration;Tactile cues;Verbal cues    Comprehension  Verbalized understanding;Returned demonstration;Verbal cues required;Tactile cues required       PT Short Term Goals - 05/28/17 1110      PT SHORT TERM GOAL #1   Title  Pt will be independent with HEP in order to decrease neck pain so she can improve pain-free function at home and work.    Time  3    Period  Weeks    Status  New    Target Date  06/18/17        PT Long Term Goals - 06/28/17 0836      PT LONG TERM GOAL #1   Title  Pt will demonstrate decrease in NDI by at least 19% in order to demonstrate clinically significant reduction in disability related to neck injury/pain     Baseline  05/28/17: 22%     06/28/17 14%     Time  6    Period  Weeks    Status  New      PT LONG TERM GOAL #2   Title  Pt will decrease worst pain as reported on NPRS by at least 2 points in order to demonstrate clinically significant reduction in pain.    Baseline  05/28/17: worst 10/10    06/28/17 worst 5/10 in last 24 hours in posterior nech and upper traps    Time  6    Period  Weeks    Status  Partially Met    Target Date  07/09/17      PT LONG TERM GOAL #3   Title  Pt will report no further numbness/tingling in LUE in order to improve her ability to sleep for a full night without waking up    Baseline  05/28/17: numbness/tingling wakes her up at night.     06/28/17: Pt is having less N/T at night and does not wake up as often; only one episode since starting therapy.  Time  6    Period  Weeks     Status  Partially Met    Target Date  07/09/17      PT LONG TERM GOAL #4   Title  Pt will report decrease in her headache frequency to once/week at most in order to improve her ability to function at work    Baseline  05/28/17: every other day        06/28/17: Pt having headaches every 3 days with decreased intensity    Time  6    Period  Weeks    Status  Partially Met    Target Date  07/09/17            Plan - 06/28/17 1831    Clinical Impression Statement  Pt's goal updated today with good improvement in NDI (just below MCID). Pt is progressing towards her pain and headache goals with worst pain decreased from 10/10 to 5/10 in neck and head. Headaches are decreasing in frequency and intensity. Pt will benefit from continued therapy for 2-3 more sessions o ensure she is independent with all HEP with all goals met.     Clinical Presentation  Evolving    Clinical Decision Making  Moderate    Rehab Potential  Good    PT Frequency  2x / week    PT Duration  6 weeks    PT Treatment/Interventions  ADLs/Self Care Home Management;Biofeedback;Cryotherapy;Electrical Stimulation;Iontophoresis 52m/ml Dexamethasone;Moist Heat;Traction;Ultrasound;DME Instruction;Therapeutic activities;Therapeutic exercise;Neuromuscular re-education;Manual techniques;Patient/family education;Passive range of motion;Dry needling    PT Next Visit Plan  Continue scap retraction, begin resisted UE with Nautilus, STM, cervical traction, dry needling upper trap trigger points, 1st rib mobs, subscap release, pec minor active release    PT Home Exercise Plan  Added thoracic extension, lat pull-down; Continued nerve glide in sitting, Cervical traction and stretches for upper traps, levator, repeated cervical retractions (see instructions)    Consulted and Agree with Plan of Care  Patient       Patient will benefit from skilled therapeutic intervention in order to improve the following deficits and impairments:  Impaired  sensation, Pain, Decreased range of motion  Visit Diagnosis: Cervicalgia  Chronic nonintractable headache, unspecified headache type     Problem List Patient Active Problem List   Diagnosis Date Noted  . Abnormal breast finding 05/21/2017  . Cervical arthritis 05/11/2017  . Headache 09/27/2016  . DM type 2 (diabetes mellitus, type 2) ([place] 10/01/2015  . Post-surgical hypothyroidism 10/01/2015  . Hypercholesteremia 10/01/2015  . Medication monitoring encounter 10/01/2015  . Need for hepatitis C screening test 10/01/2015  . Screening for HIV (human immunodeficiency virus) 10/01/2015   Verdie Barrows MLenis Dickinson SPT MPura Spice PT, DPT # [id number]/15/2018, 6:35 PM  [place] [facility] [address]Phone: [phone]  Fax:  [phone] Name: PORLINDA SLOMSKIMRN: [id number] of Birth: 2Jul 06, 1957

## 2017-07-02 ENCOUNTER — Ambulatory Visit: Payer: BLUE CROSS/BLUE SHIELD | Admitting: Physical Therapy

## 2017-07-02 ENCOUNTER — Encounter: Payer: Self-pay | Admitting: Physical Therapy

## 2017-07-02 DIAGNOSIS — R51 Headache: Secondary | ICD-10-CM

## 2017-07-02 DIAGNOSIS — M542 Cervicalgia: Secondary | ICD-10-CM

## 2017-07-02 DIAGNOSIS — R519 Headache, unspecified: Secondary | ICD-10-CM

## 2017-07-02 NOTE — Therapy (Signed)
[place] [facility] [address] [place], [place], [address] Phone: [phone]   Fax:  [phone]  Physical Therapy Treatment  Patient Details  Name: Susan Bates MRN: [id number] Date of Birth: October 22, 1955 Referring Provider: Dr. Sanda Klein   Encounter Date: 07/02/2017  PT End of Session - 07/02/17 1006    Visit Number  9    Number of Visits  13    Date for PT Re-Evaluation  07/09/17    PT Start Time  0901    PT Stop Time  0950    PT Time Calculation (min)  49 min    Equipment Utilized During Treatment  Gait belt    Activity Tolerance  Patient tolerated treatment well;No increased pain    Behavior During Therapy  WFL for tasks assessed/performed       Past Medical History:  Diagnosis Date  . Anxiety   . Depression   . Diabetes mellitus without complication ([place])   . High cholesterol   . Thyroid disease     Past Surgical History:  Procedure Laterality Date  . ABDOMINAL HYSTERECTOMY    . thyriodectomy    . TUBAL LIGATION      There were no vitals filed for this visit.  Subjective Assessment - 07/02/17 0848    Subjective  Pt reports mild tingling in LUE, no pain currently, nothing new to report    Pertinent History  Pt reports history of headache for multiple years. Headaches are several times a week approximately every other day. She has a history of migraines but hasn't had one in approximately 4 months. 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 She also has a history of neck pain for multiple years. She suffered a syncopal episode in 2007 with head trauma. She had a cervical spine CT scan at that time which revealed arthritis. Pt reports bilateral UE numbness L>R while she sleeps but occasionally during the day which has worsened over the last year. She states that the numbness extends down her entire arms bilaterally. She has noticed decreased grip strength as well. She now is reporting numbness  extending into LLE for the last 3 months. She notices numbness in her L arm and LLE at the same time but is unsure if they are related. She feels tightness in the muscles of her neck. "It feels like if it would pop it would feel better." She reports that she hears some crunching and popping when she moves her head/neck. She has been to a Restaurant manager, fast food in the past (2007) in [place] and it helped with her migraines. No reported changes in symptoms in AM compared to PM. Pt reports occasionally dizzy but not presyncopal. She reports some night sweats. Pain wakes her up at night. Denies chills, fevers, loss of bowel/bladder control.     Diagnostic tests  Arthritis in spine from cervical CT    Patient Stated Goals  "It would be nice not to wake up at night with arms numb". Improve headaches. No functional limitations    Currently in Pain?  No/denies    Pain Onset  Today        Treatment:    Manual therapy:   Supine:             Cervical stretching including rotation, UT, scalene, levator using muscle energy technique for contract-relax stretch, 5 sec contract and 20 sec relax x 3 iterations each side    Cervical distraction x  2-3 min    Suboccipital release x 2-3 min   Sitting:             STM to upper traps and interscapular musculature, multiple trigger points noted around superior angle of bilateral scapulae; compression release performed.  Ther-ex: Circuit x 2 Standing:             Lat pull-down with GTB x 20 reps    30# Row x 20    40# Shug with bar x 20  Sitting:     Y press 6# 2 x 10             Repeated thoracic extension x 10 reps with 5-10 sec hold.   HEP updated with shoulder press, postural re-education (wall snow angel), bilateral shoulder ER, and re-instructed doorway/corner pec stretch. (see pt instructions)   PT Education - 07/02/17 0956    Education provided  Yes    Education Details  HEP updated, exercise technique    Person(s) Educated  Patient    Methods   Explanation;Demonstration;Tactile cues;Verbal cues    Comprehension  Verbalized understanding;Returned demonstration;Verbal cues required;Tactile cues required       PT Short Term Goals - 05/28/17 1110      PT SHORT TERM GOAL #1   Title  Pt will be independent with HEP in order to decrease neck pain so she can improve pain-free function at home and work.    Time  3    Period  Weeks    Status  New    Target Date  06/18/17        PT Long Term Goals - 06/28/17 0836      PT LONG TERM GOAL #1   Title  Pt will demonstrate decrease in NDI by at least 19% in order to demonstrate clinically significant reduction in disability related to neck injury/pain     Baseline  05/28/17: 22%     06/28/17 14%     Time  6    Period  Weeks    Status  New      PT LONG TERM GOAL #2   Title  Pt will decrease worst pain as reported on NPRS by at least 2 points in order to demonstrate clinically significant reduction in pain.    Baseline  05/28/17: worst 10/10    06/28/17 worst 5/10 in last 24 hours in posterior nech and upper traps    Time  6    Period  Weeks    Status  Partially Met    Target Date  07/09/17      PT LONG TERM GOAL #3   Title  Pt will report no further numbness/tingling in LUE in order to improve her ability to sleep for a full night without waking up    Baseline  05/28/17: numbness/tingling wakes her up at night.     06/28/17: Pt is having less N/T at night and does not wake up as often; only one episode since starting therapy.    Time  6    Period  Weeks    Status  Partially Met    Target Date  07/09/17      PT LONG TERM GOAL #4   Title  Pt will report decrease in her headache frequency to once/week at most in order to improve her ability to function at work    Baseline  05/28/17: every other day        06/28/17: Pt having headaches every 3 days with  decreased intensity    Time  6    Period  Weeks    Status  Partially Met    Target Date  07/09/17         Plan - 07/02/17  1007    Clinical Impression Statement  Pt continues to improve. She had no pain during session. Radicular symptoms in L UE are still present, but with decreased intensity. Tingling decreased further following session. Pt is transitioning into shoulder girdle strengthening for transition into gym/HEP program at [employer].     Clinical Presentation  Evolving    Clinical Decision Making  Moderate    Rehab Potential  Good    PT Frequency  2x / week    PT Duration  6 weeks    PT Treatment/Interventions  ADLs/Self Care Home Management;Biofeedback;Cryotherapy;Electrical Stimulation;Iontophoresis 45m/ml Dexamethasone;Moist Heat;Traction;Ultrasound;DME Instruction;Therapeutic activities;Therapeutic exercise;Neuromuscular re-education;Manual techniques;Patient/family education;Passive range of motion;Dry needling    PT Next Visit Plan  Continue resisted UE/shoulder strengthening with Nautilus, STM, cervical traction, dry needling upper trap trigger points, 1st rib mobs, subscap release, pec minor active release    PT Home Exercise Plan  Added shoulder press, postural re-education (wall snow angel), bilateral shoulder ER, and re-instructed doorway/corner pec stretch. (see pt instructions). Continued thoracic extension, lat pull-down; Continued nerve glide in sitting, Cervical traction and stretches for upper traps, levator, repeated cervical retractions (see instructions)    Consulted and Agree with Plan of Care  Patient       Patient will benefit from skilled therapeutic intervention in order to improve the following deficits and impairments:  Impaired sensation, Pain, Decreased range of motion  MPura Spice PT, DPT # [phone] Diagnosis: Cervicalgia  Chronic nonintractable headache, unspecified headache type     Problem List Patient Active Problem List   Diagnosis Date Noted  . Abnormal breast finding 05/21/2017  . Cervical arthritis 05/11/2017  . Headache 09/27/2016  . DM type 2 (diabetes  m[place]itus, type 2) ([place] 10/01/2015  . Post-surgical hypothyroidism 10/01/2015  . Hypercholesteremia 10/01/2015  . Medication monitoring encounter 10/01/2015  . Need for hepatitis C screening test 10/01/2015  . Screening for HIV (human immunodeficiency virus) 10/01/2015   Conna Terada MLenis Dickinson SPT MPura Spice PT, DPT # [id number]/19/2018, 10:12 AM  [place] [facility] [address]Phone: [phone]  Fax:  [phone] Name: Susan CORCINOMRN: [id number] of Birth: 2April 22, 1957

## 2017-07-09 ENCOUNTER — Encounter: Payer: BLUE CROSS/BLUE SHIELD | Admitting: Physical Therapy

## 2017-08-22 ENCOUNTER — Telehealth: Payer: Self-pay | Admitting: Family Medicine

## 2017-08-22 NOTE — Telephone Encounter (Signed)
Please ask patient to get the labs done that were due around November 29th We'd like to see how the cholesterol medicine is doing and if the dose needs adjusting Thank you

## 2017-08-23 NOTE — Telephone Encounter (Signed)
Called pt no answer LM for pt informing her of the need to come into office an get labs done. CRM created.

## 2017-09-01 LAB — LIPID PANEL
CHOL/HDL RATIO: 4 ratio (ref 0.0–4.4)
Cholesterol, Total: 153 mg/dL (ref 100–199)
HDL: 38 mg/dL — AB (ref >39)
LDL CALC: 81 mg/dL (ref 0–99)
Triglycerides: 172 mg/dL — ABNORMAL HIGH (ref 0–149)
VLDL Cholesterol Cal: 34 mg/dL (ref 5–40)

## 2017-09-01 LAB — ALT: ALT: 18 IU/L (ref 0–32)

## 2017-10-25 ENCOUNTER — Other Ambulatory Visit: Payer: Self-pay | Admitting: Family Medicine

## 2017-10-25 NOTE — Telephone Encounter (Signed)
Last labs reviewed 

## 2017-11-09 ENCOUNTER — Telehealth: Payer: Self-pay

## 2017-11-09 ENCOUNTER — Ambulatory Visit: Payer: BLUE CROSS/BLUE SHIELD | Admitting: Family Medicine

## 2017-11-09 DIAGNOSIS — E890 Postprocedural hypothyroidism: Secondary | ICD-10-CM

## 2017-11-09 DIAGNOSIS — E7800 Pure hypercholesterolemia, unspecified: Secondary | ICD-10-CM

## 2017-11-09 DIAGNOSIS — Z5181 Encounter for therapeutic drug level monitoring: Secondary | ICD-10-CM

## 2017-11-09 DIAGNOSIS — E119 Type 2 diabetes mellitus without complications: Secondary | ICD-10-CM

## 2017-11-09 NOTE — Telephone Encounter (Signed)
Copied from CRM [phone]. Topic: General - Other >> Nov 08, 2017  4:57 PM Darletta MollLander, Susan Bates wrote: Reason for CRM: Patient r/s her appt w/ Lada to 05/10 at 2:20pm and is supposed to have fasting labs. She wants to know if she get the lab orders the week before and take them to [facility]?

## 2017-11-12 NOTE — Telephone Encounter (Signed)
Yes, orders have been entered for [facility] Thank you

## 2017-11-13 NOTE — Telephone Encounter (Signed)
Pt.notified

## 2017-12-21 ENCOUNTER — Ambulatory Visit: Payer: BLUE CROSS/BLUE SHIELD | Admitting: Family Medicine

## 2017-12-30 ENCOUNTER — Other Ambulatory Visit: Payer: Self-pay | Admitting: Family Medicine

## 2018-01-04 ENCOUNTER — Other Ambulatory Visit: Payer: Self-pay | Admitting: Family Medicine

## 2018-01-04 DIAGNOSIS — N6459 Other signs and symptoms in breast: Secondary | ICD-10-CM

## 2018-01-09 ENCOUNTER — Other Ambulatory Visit: Payer: Self-pay | Admitting: Family Medicine

## 2018-01-09 ENCOUNTER — Telehealth: Payer: Self-pay

## 2018-01-09 DIAGNOSIS — Z1239 Encounter for other screening for malignant neoplasm of breast: Secondary | ICD-10-CM

## 2018-01-09 DIAGNOSIS — N6320 Unspecified lump in the left breast, unspecified quadrant: Secondary | ICD-10-CM

## 2018-01-09 DIAGNOSIS — E890 Postprocedural hypothyroidism: Secondary | ICD-10-CM

## 2018-01-09 LAB — LIPID PANEL
CHOL/HDL RATIO: 4.6 ratio — AB (ref 0.0–4.4)
Cholesterol, Total: 174 mg/dL (ref 100–199)
HDL: 38 mg/dL — ABNORMAL LOW (ref >39)
LDL Calculated: 99 mg/dL (ref 0–99)
Triglycerides: 185 mg/dL — ABNORMAL HIGH (ref 0–149)
VLDL Cholesterol Cal: 37 mg/dL (ref 5–40)

## 2018-01-09 LAB — MICROALBUMIN / CREATININE URINE RATIO

## 2018-01-09 LAB — COMPREHENSIVE METABOLIC PANEL
A/G RATIO: 1.8 (ref 1.2–2.2)
ALBUMIN: 4.6 g/dL (ref 3.6–4.8)
ALK PHOS: 64 IU/L (ref 39–117)
ALT: 13 IU/L (ref 0–32)
AST: 16 IU/L (ref 0–40)
BILIRUBIN TOTAL: 0.2 mg/dL (ref 0.0–1.2)
BUN / CREAT RATIO: 18 (ref 12–28)
BUN: 12 mg/dL (ref 8–27)
CHLORIDE: 97 mmol/L (ref 96–106)
CO2: 24 mmol/L (ref 20–29)
Calcium: 9.6 mg/dL (ref 8.7–10.3)
Creatinine, Ser: 0.65 mg/dL (ref 0.57–1.00)
GFR calc Af Amer: 112 mL/min/{1.73_m2} (ref >59)
GFR calc non Af Amer: 97 mL/min/{1.73_m2} (ref >59)
GLUCOSE: 111 mg/dL — AB (ref 65–99)
Globulin, Total: 2.5 g/dL (ref 1.5–4.5)
POTASSIUM: 5 mmol/L (ref 3.5–5.2)
SODIUM: 136 mmol/L (ref 134–144)
TOTAL PROTEIN: 7.1 g/dL (ref 6.0–8.5)

## 2018-01-09 LAB — HEMOGLOBIN A1C
ESTIMATED AVERAGE GLUCOSE: 140 mg/dL
HEMOGLOBIN A1C: 6.5 % — AB (ref 4.8–5.6)

## 2018-01-09 LAB — TSH+FREE T4
Free T4: 1.02 ng/dL (ref 0.82–1.77)
TSH: 0.014 u[IU]/mL — ABNORMAL LOW (ref 0.450–4.500)

## 2018-01-09 MED ORDER — THYROID 60 MG PO TABS: 60.0000 mg | ORAL_TABLET | Freq: Every day | ORAL | 1 refills | Status: DC

## 2018-01-09 NOTE — Progress Notes (Signed)
Change thyroid dose; recheck TSH and free T4 and T3 in 6 weeks; orders entered

## 2018-01-09 NOTE — Telephone Encounter (Signed)
Copied from CRM [id number]. Topic: General - Other >> Jan 04, 2018  2:31 PM Debroah Loop wrote: Reason for CRM: Patient needs a call back to get order for diagnostic mammo to gove to the [facility].

## 2018-01-15 ENCOUNTER — Encounter: Payer: Self-pay | Admitting: Family Medicine

## 2018-01-15 ENCOUNTER — Ambulatory Visit: Payer: BLUE CROSS/BLUE SHIELD | Admitting: Family Medicine

## 2018-01-15 VITALS — BP 124/76 | HR 91 | Temp 98.1°F | Resp 16 | Ht 65.0 in | Wt 181.6 lb

## 2018-01-15 DIAGNOSIS — R51 Headache: Secondary | ICD-10-CM

## 2018-01-15 DIAGNOSIS — E65 Localized adiposity: Secondary | ICD-10-CM

## 2018-01-15 DIAGNOSIS — E7800 Pure hypercholesterolemia, unspecified: Secondary | ICD-10-CM

## 2018-01-15 DIAGNOSIS — R519 Headache, unspecified: Secondary | ICD-10-CM

## 2018-01-15 DIAGNOSIS — E1100 Type 2 diabetes mellitus with hyperosmolarity without nonketotic hyperglycemic-hyperosmolar coma (NKHHC): Secondary | ICD-10-CM

## 2018-01-15 DIAGNOSIS — E669 Obesity, unspecified: Secondary | ICD-10-CM

## 2018-01-15 DIAGNOSIS — E890 Postprocedural hypothyroidism: Secondary | ICD-10-CM | POA: Diagnosis not present

## 2018-01-15 MED ORDER — FLUOXETINE HCL 40 MG PO CAPS: 40.0000 mg | ORAL_CAPSULE | Freq: Every day | ORAL | 3 refills | Status: DC

## 2018-01-15 MED ORDER — SEMAGLUTIDE(0.25 OR 0.5MG/DOS) 2 MG/1.5ML ~~LOC~~ SOPN: PEN_INJECTOR | SUBCUTANEOUS | 0 refills | Status: DC

## 2018-01-15 NOTE — Patient Instructions (Addendum)
Please address the thyroid issue with your specialist, hormone replacement doctor at [facility] because I believe you should be taking a LOWER dose of thyroid replacement, such as 60 mg instead of 90 mg Start the Ozempic and give yourself an injection once a week You are welcome to have either the pharmacist teach you or return here for teaching Continue the metformin Call me in 3-4 weeks with an update on your mood, but call sooner if needed  Semaglutide injection solution What is this medicine? SEMAGLUTIDE (Sem a GLOO tide) is used to improve blood sugar control in adults with type 2 diabetes. This medicine may be used with other diabetes medicines. This medicine may be used for other purposes; ask your health care provider or pharmacist if you have questions. COMMON BRAND NAME(S): OZEMPIC What should I tell my health care provider before I take this medicine? They need to know if you have any of these conditions: -endocrine tumors (MEN 2) or if someone in your family had these tumors -eye disease, vision problems -history of pancreatitis -kidney disease -stomach problems -thyroid cancer or if someone in your family had thyroid cancer -an unusual or allergic reaction to semaglutide, other medicines, foods, dyes, or preservatives -pregnant or trying to get pregnant -breast-feeding How should I use this medicine? This medicine is for injection under the skin of your upper leg (thigh), stomach area, or upper arm. It is given once every week (every 7 days). You will be taught how to prepare and give this medicine. Use exactly as directed. Take your medicine at regular intervals. Do not take it more often than directed. If you use this medicine with insulin, you should inject this medicine and the insulin separately. Do not mix them together. Do not give the injections right next to each other. Change (rotate) injection sites with each injection. It is important that you put your used needles  and syringes in a special sharps container. Do not put them in a trash can. If you do not have a sharps container, call your pharmacist or healthcare provider to get one. A special MedGuide will be given to you by the pharmacist with each prescription and refill. Be sure to read this information carefully each time. Talk to your pediatrician regarding the use of this medicine in children. Special care may be needed. Overdosage: If you think you have taken too much of this medicine contact a poison control center or emergency room at once. NOTE: This medicine is only for you. Do not share this medicine with others. What if I miss a dose? If you miss a dose, take it as soon as you can within 5 days after the missed dose. Then take your next dose at your regular weekly time. If it has been longer than 5 days after the missed dose, do not take the missed dose. Take the next dose at your regular time. Do not take double or extra doses. If you have questions about a missed dose, contact your health care provider for advice. What may interact with this medicine? -other medicines for diabetes Many medications may cause changes in blood sugar, these include: -alcohol containing beverages -antiviral medicines for HIV or AIDS -aspirin and aspirin-like drugs -certain medicines for blood pressure, heart disease, irregular heart beat -chromium -diuretics -female hormones, such as estrogens or progestins, birth control pills -fenofibrate -gemfibrozil -isoniazid -lanreotide -female hormones or anabolic steroids -MAOIs like Carbex, Eldepryl, Marplan, Nardil, and Parnate -medicines for weight loss -medicines for allergies, asthma,  cold, or cough -medicines for depression, anxiety, or psychotic disturbances -niacin -nicotine -NSAIDs, medicines for pain and inflammation, like ibuprofen or naproxen -octreotide -pasireotide -pentamidine -phenytoin -probenecid -quinolone antibiotics such as ciprofloxacin,  levofloxacin, ofloxacin -some herbal dietary supplements -steroid medicines such as prednisone or cortisone -sulfamethoxazole; trimethoprim -thyroid hormones Some medications can hide the warning symptoms of low blood sugar (hypoglycemia). You may need to monitor your blood sugar more closely if you are taking one of these medications. These include: -beta-blockers, often used for high blood pressure or heart problems (examples include atenolol, metoprolol, propranolol) -clonidine -guanethidine -reserpine This list may not describe all possible interactions. Give your health care provider a list of all the medicines, herbs, non-prescription drugs, or dietary supplements you use. Also tell them if you smoke, drink alcohol, or use illegal drugs. Some items may interact with your medicine. What should I watch for while using this medicine? Visit your doctor or health care professional for regular checks on your progress. Drink plenty of fluids while taking this medicine. Check with your doctor or health care professional if you get an attack of severe diarrhea, nausea, and vomiting. The loss of too much body fluid can make it dangerous for you to take this medicine. A test called the HbA1C (A1C) will be monitored. This is a simple blood test. It measures your blood sugar control over the last 2 to 3 months. You will receive this test every 3 to 6 months. Learn how to check your blood sugar. Learn the symptoms of low and high blood sugar and how to manage them. Always carry a quick-source of sugar with you in case you have symptoms of low blood sugar. Examples include hard sugar candy or glucose tablets. Make sure others know that you can choke if you eat or drink when you develop serious symptoms of low blood sugar, such as seizures or unconsciousness. They must get medical help at once. Tell your doctor or health care professional if you have high blood sugar. You might need to change the dose of your  medicine. If you are sick or exercising more than usual, you might need to change the dose of your medicine. Do not skip meals. Ask your doctor or health care professional if you should avoid alcohol. Many nonprescription cough and cold products contain sugar or alcohol. These can affect blood sugar. Pens should never be shared. Even if the needle is changed, sharing may result in passing of viruses like hepatitis or HIV. Wear a medical ID bracelet or chain, and carry a card that describes your disease and details of your medicine and dosage times. What side effects may I notice from receiving this medicine? Side effects that you should report to your doctor or health care professional as soon as possible: -allergic reactions like skin rash, itching or hives, swelling of the face, lips, or tongue -breathing problems -changes in vision -diarrhea that continues or is severe -lump or swelling on the neck -severe nausea -signs and symptoms of infection like fever or chills; cough; sore throat; pain or trouble passing urine -signs and symptoms of low blood sugar such as feeling anxious, confusion, dizziness, increased hunger, unusually weak or tired, sweating, shakiness, cold, irritable, headache, blurred vision, fast heartbeat, loss of consciousness -signs and symptoms of kidney injury like trouble passing urine or change in the amount of urine -trouble swallowing -unusual stomach upset or pain -vomiting Side effects that usually do not require medical attention (report to your doctor or health care professional  if they continue or are bothersome): -constipation -diarrhea -nausea -pain, redness, or irritation at site where injected -stomach upset This list may not describe all possible side effects. Call your doctor for medical advice about side effects. You may report side effects to FDA at 1-800-FDA-1088. Where should I keep my medicine? Keep out of the reach of children. Store unopened pens  in a refrigerator between 2 and 8 degrees C (36 and 46 degrees F). Do not freeze. Protect from light and heat. After you first use the pen, it can be stored for 56 days at room temperature between 15 and 30 degrees C (59 and 86 degrees F) or in a refrigerator. Throw away your used pen after 56 days or after the expiration date, whichever comes first. Do not store your pen with the needle attached. If the needle is left on, medicine may leak from the pen. NOTE: This sheet is a summary. It may not cover all possible information. If you have questions about this medicine, talk to your doctor, pharmacist, or health care provider.  2018 Elsevier/Gold Standard (2016-08-17 14:43:35)

## 2018-01-15 NOTE — Assessment & Plan Note (Signed)
Glucoses and A1c have been creeping up; patient has also been gaining weight; BMI > 30; will initiate GLP-1; she denies contraindications to treatment; continue metformin; no sulfonylurea; I believe with weight loss, we may be able to stop the GLP-1 and perhaps even the metformin after several months, so we'll check in with her progress in 3 months; foot exam by MD; eye exam UTD; urine microalbumin:Cr collected today

## 2018-01-15 NOTE — Assessment & Plan Note (Signed)
I had already changed her thyroid medicine without realizing that was managed through a different office; reviewed labs with patient; urged her to contact that provider; explained over-replacement can lead to osteoporosis, heart issues, fatigue, etc.; she agrees

## 2018-01-15 NOTE — Progress Notes (Signed)
BP 124/76   Pulse 91   Temp 98.1 F (36.7 C) (Oral)   Resp 16   Ht 5\' 5"  (1.651 m)   Wt 181 lb 9.6 oz (82.4 kg)   SpO2 95%   BMI 30.22 kg/m    Subjective:    Patient ID: Susan Bates, female    DOB: [birth year], 62 y.o.   MRN: [id number]  HPI: Susan Bates is a 62 y.o. female  Chief Complaint  Patient presents with  . Follow-up    HPI Patient is here for f/u She has thyroid dysfunction; was getting her thryoid medicine through [facility]; I did not realize at the time I checked her labs and reduced her dose that this was managed through another provider; she says she has ben taking 90 mg until today and just picked up my Rx; she is happy to start my Rx and will still talk to them; I advised her to do so since she is getting too much replacement Truncal obesity; no new stretch marks; flight attendant so always tired; was bleeding the other day from a minor scratch, does bruise some, not excessively; more pimples, does HRT; some chin hairs Trouble losing weight; exercising some; mostly walking; when she is working, she is up on her feet, so sore because she is using different muscles on the job Feeling more irritable, but hard to know if just tired; occasionally sad and tearful Other family members have builds of thicker middles and thinner legs Type 2 diabetes; sugars have gone up a little bit; averaged 121-127 but now highest is 147 Low HDL at 38, stable LDL came down from 148 to 81 and back up to 99 Eating is better; limiting sweets Lab Results  Component Value Date   HGBA1C 6.5 (H) 01/08/2018  hypothyroidism; she had been taking 90 mg of thyroid She will start the 60 mg of thyroid med soon NO hx of medullary thyroid carcinoma; she just had a nodule that they drained and finally took out 80% and it was fine, no cancer; no MEN-2 in the family Headaches; doing exercises which are helping; not an everyday thing any more  Depression screen [facility] 2/9 01/15/2018 05/11/2017  09/27/2016 03/14/2016  Decreased Interest 1 0 0 0  Down, Depressed, Hopeless 3 0 1 0  PHQ - 2 Score 4 0 1 0  Altered sleeping 1 - - -  Tired, decreased energy 1 - - -  Change in appetite 1 - - -  Feeling bad or failure about yourself  1 - - -  Trouble concentrating 2 - - -  Moving slowly or fidgety/restless 0 - - -  Suicidal thoughts 0 - - -  PHQ-9 Score 10 - - -  Difficult doing work/chores Not difficult at all - - -  MD note: no SI/HI  Relevant past medical, surgical, family and social history reviewed Past Medical History:  Diagnosis Date  . Anxiety   . Depression   . Diabetes mellitus without complication (HCC)   . High cholesterol   . Thyroid disease    Past Surgical History:  Procedure Laterality Date  . ABDOMINAL HYSTERECTOMY    . thyriodectomy    . TUBAL LIGATION     Family History  Problem Relation Age of Onset  . Hodgkin's lymphoma Father   . Cancer Sister   . Cervical cancer Sister   . Arthritis Maternal Aunt   . Breast cancer Neg Hx   MD note: no family hx  of colon cancer  Social History   Tobacco Use  . Smoking status: Never Smoker  . Smokeless tobacco: Never Used  Substance Use Topics  . Alcohol use: Yes    Comment: occasional  . Drug use: No    Interim medical history since last visit reviewed. Allergies and medications reviewed  Review of Systems Per HPI unless specifically indicated above     Objective:    BP 124/76   Pulse 91   Temp 98.1 F (36.7 C) (Oral)   Resp 16   Ht 5\' 5"  (1.651 m)   Wt 181 lb 9.6 oz (82.4 kg)   SpO2 95%   BMI 30.22 kg/m   Wt Readings from Last 3 Encounters:  01/15/18 181 lb 9.6 oz (82.4 kg)  05/11/17 180 lb 8 oz (81.9 kg)  09/27/16 175 lb 5 oz (79.5 kg)    Physical Exam  Constitutional: She appears well-developed and well-nourished. No distress.  obese  HENT:  Head: Normocephalic and atraumatic.  Eyes: EOM are normal. No scleral icterus.  Neck: No thyromegaly present.  Cardiovascular: Normal rate,  regular rhythm and normal heart sounds.  No murmur heard. Pulmonary/Chest: Effort normal and breath sounds normal.  Abdominal: Soft. Bowel sounds are normal. She exhibits no distension.  Musculoskeletal: Normal range of motion. She exhibits no edema.  Neurological: She is alert. She exhibits normal muscle tone.  Skin: Skin is warm and dry. She is not diaphoretic. No pallor.  Psychiatric: She has a normal mood and affect. Her behavior is normal. Judgment and thought content normal.   Diabetic Foot Form - Detailed   Diabetic Foot Exam - detailed Diabetic Foot exam was performed with the following findings:  Yes 01/15/2018 12:18 PM  Visual Foot Exam completed.:  Yes  Pulse Foot Exam completed.:  Yes  Right Dorsalis Pedis:  Present Left Dorsalis Pedis:  Present  Sensory Foot Exam Completed.:  Yes Semmes-Weinstein Monofilament Test R Site 1-Great Toe:  Pos L Site 1-Great Toe:  Pos       Results for orders placed or performed in visit on 11/09/17  TSH + free T4  Result Value Ref Range   TSH 0.014 (L) 0.450 - 4.500 uIU/mL   Free T4 1.02 0.82 - 1.77 ng/dL  Comprehensive metabolic panel  Result Value Ref Range   Glucose 111 (H) 65 - 99 mg/dL   BUN 12 8 - 27 mg/dL   Creatinine, Ser 4.090.65 0.57 - 1.00 mg/dL   GFR calc non Af Amer 97 >59 mL/min/1.73   GFR calc Af Amer 112 >59 mL/min/1.73   BUN/Creatinine Ratio 18 12 - 28   Sodium 136 134 - 144 mmol/L   Potassium 5.0 3.5 - 5.2 mmol/L   Chloride 97 96 - 106 mmol/L   CO2 24 20 - 29 mmol/L   Calcium 9.6 8.7 - 10.3 mg/dL   Total Protein 7.1 6.0 - 8.5 g/dL   Albumin 4.6 3.6 - 4.8 g/dL   Globulin, Total 2.5 1.5 - 4.5 g/dL   Albumin/Globulin Ratio 1.8 1.2 - 2.2   Bilirubin Total 0.2 0.0 - 1.2 mg/dL   Alkaline Phosphatase 64 39 - 117 IU/L   AST 16 0 - 40 IU/L   ALT 13 0 - 32 IU/L  Microalbumin / creatinine urine ratio  Result Value Ref Range   Creatinine, Urine CANCELED mg/dL   Microalbumin, Urine CANCELED   Lipid panel  Result Value Ref  Range   Cholesterol, Total 174 100 - 199 mg/dL  Triglycerides 185 (H) 0 - 149 mg/dL   HDL 38 (L) >40 mg/dL   VLDL Cholesterol Cal 37 5 - 40 mg/dL   LDL Calculated 99 0 - 99 mg/dL   Chol/HDL Ratio 4.6 (H) 0.0 - 4.4 ratio  Hemoglobin A1c  Result Value Ref Range   Hgb A1c MFr Bld 6.5 (H) 4.8 - 5.6 %   Est. average glucose Bld gHb Est-mCnc 140 mg/dL      Assessment & Plan:   Problem List Items Addressed This Visit      Endocrine   Post-surgical hypothyroidism    I had already changed her thyroid medicine without realizing that was managed through a different office; reviewed labs with patient; urged her to contact that provider; explained over-replacement can lead to osteoporosis, heart issues, fatigue, etc.; she agrees      DM type 2 (diabetes mellitus, type 2) (HCC) - Primary    Glucoses and A1c have been creeping up; patient has also been gaining weight; BMI > 30; will initiate GLP-1; she denies contraindications to treatment; continue metformin; no sulfonylurea; I believe with weight loss, we may be able to stop the GLP-1 and perhaps even the metformin after several months, so we'll check in with her progress in 3 months; foot exam by MD; eye exam UTD; urine microalbumin:Cr collected today      Relevant Medications   Semaglutide (OZEMPIC) 0.25 or 0.5 MG/DOSE SOPN   Other Relevant Orders   Microalbumin / creatinine urine ratio     Other   Obesity (BMI 30.0-34.9)    Weight loss encouraged; start GLP-1 for diabetes which should assist with weight loss; f/u in 3 months      Hypercholesteremia    Much improved relative to previous draw a year ago; reviewed goal HDL and LDL and TG with her; she'll try to lose weight and increase activity to help raise HDL and watch diet to lower LDL      Headache    Improved with PT      Relevant Medications   FLUoxetine (PROZAC) 40 MG capsule    Other Visit Diagnoses    Truncal obesity       may be familial build, but will check AM  cortisol and ACTH to verify   Relevant Medications   Semaglutide (OZEMPIC) 0.25 or 0.5 MG/DOSE SOPN   Other Relevant Orders   Cortisol   ACTH   ACTH   Cortisol       Follow up plan: Return in about 3 months (around 04/17/2018) for follow-up visit with Dr. Sherie Don.  An after-visit summary was printed and given to the patient at check-out.  Please see the patient instructions which may contain other information and recommendations beyond what is mentioned above in the assessment and plan.  Meds ordered this encounter  Medications  . FLUoxetine (PROZAC) 40 MG capsule    Sig: Take 1 capsule (40 mg total) by mouth daily.    Dispense:  90 capsule    Refill:  3    Increasing dose  . Semaglutide (OZEMPIC) 0.25 or 0.5 MG/DOSE SOPN    Sig: Inject 0.25 mg into the skin once a week for 28 days, THEN 0.5 mg once a week for 28 days.    Dispense:  1.5 mL    Refill:  0    Orders Placed This Encounter  Procedures  . Cortisol  . ACTH  . ACTH  . Cortisol  . Microalbumin / creatinine urine ratio

## 2018-01-15 NOTE — Assessment & Plan Note (Signed)
Improved with PT.

## 2018-01-15 NOTE — Assessment & Plan Note (Signed)
Weight loss encouraged; start GLP-1 for diabetes which should assist with weight loss; f/u in 3 months

## 2018-01-15 NOTE — Assessment & Plan Note (Signed)
Much improved relative to previous draw a year ago; reviewed goal HDL and LDL and TG with her; she'll try to lose weight and increase activity to help raise HDL and watch diet to lower LDL

## 2018-01-16 LAB — MICROALBUMIN / CREATININE URINE RATIO
Creatinine, Urine: 17 mg/dL — ABNORMAL LOW (ref 20–275)
MICROALB/CREAT RATIO: 12 ug/mg{creat} (ref <30)
Microalb, Ur: 0.2 mg/dL

## 2018-01-17 LAB — ACTH: ACTH: 31.8 pg/mL (ref 7.2–63.3)

## 2018-01-17 LAB — CORTISOL: CORTISOL: 14.5 ug/dL

## 2018-01-25 ENCOUNTER — Ambulatory Visit
Admission: RE | Admit: 2018-01-25 | Discharge: 2018-01-25 | Disposition: A | Discharge status: Home / Self Care | Payer: BLUE CROSS/BLUE SHIELD | Source: Ambulatory Visit | Attending: Family Medicine | Admitting: Family Medicine

## 2018-01-25 DIAGNOSIS — N6320 Unspecified lump in the left breast, unspecified quadrant: Secondary | ICD-10-CM

## 2018-01-25 DIAGNOSIS — Z1231 Encounter for screening mammogram for malignant neoplasm of breast: Secondary | ICD-10-CM | POA: Diagnosis present

## 2018-01-25 DIAGNOSIS — Z1239 Encounter for other screening for malignant neoplasm of breast: Secondary | ICD-10-CM

## 2018-02-26 ENCOUNTER — Other Ambulatory Visit: Payer: Self-pay | Admitting: Family Medicine

## 2018-02-26 ENCOUNTER — Encounter: Payer: Self-pay | Admitting: Family Medicine

## 2018-02-26 MED ORDER — SEMAGLUTIDE(0.25 OR 0.5MG/DOS) 2 MG/1.5ML ~~LOC~~ SOPN: 0.5000 mg | PEN_INJECTOR | SUBCUTANEOUS | 5 refills | Status: DC

## 2018-02-26 NOTE — Telephone Encounter (Signed)
I think I got this by mistake. Refill request for Ozempic to [facility] in [place].

## 2018-03-30 ENCOUNTER — Other Ambulatory Visit: Payer: Self-pay | Admitting: Family Medicine

## 2018-03-31 NOTE — Telephone Encounter (Signed)
Lab Results  Component Value Date   TSH 0.014 (L) 01/08/2018   Jamie, please call patient She is overdue for labs; last thyroid test abnormal See last lab result note to patient: "will ask you to come by for non-fasting labs around July 11th (no appointment needed)."  I'll provide a week to get her in for labs so she doesn't run out

## 2018-04-01 NOTE — Telephone Encounter (Signed)
Left detailed voicemail

## 2018-04-09 ENCOUNTER — Telehealth: Payer: Self-pay | Admitting: Nurse Practitioner

## 2018-04-09 DIAGNOSIS — E038 Other specified hypothyroidism: Secondary | ICD-10-CM

## 2018-04-09 MED ORDER — THYROID 60 MG PO TABS: ORAL_TABLET | ORAL | 0 refills | Status: DC

## 2018-04-09 NOTE — Telephone Encounter (Signed)
Received labs ordered by provider: [facility]; showing mildly elevated TSH 5.930; T4 0.73. Is she managing your thyroid medications; If not-Please continue taking thyroidNP 60mg  tablet daily and take an extra half dose on sundays. Please come in on 05/21/2018 for a recheck to ensure this dose is appropriate. Thanks.

## 2018-04-10 ENCOUNTER — Other Ambulatory Visit: Payer: Self-pay | Admitting: Nurse Practitioner

## 2018-04-10 DIAGNOSIS — E039 Hypothyroidism, unspecified: Secondary | ICD-10-CM

## 2018-04-10 MED ORDER — THYROID 60 MG PO TABS: ORAL_TABLET | ORAL | 0 refills | Status: DC

## 2018-04-10 NOTE — Telephone Encounter (Signed)
Switched to armour 60mg  6 days a week and 90mg  one day a week (extra half tablet); repeat labs in 6 weeks- order placed. Will need lab results prior to future refills to ensure proper dosage.

## 2018-04-10 NOTE — Telephone Encounter (Signed)
Pt.notified

## 2018-04-10 NOTE — Telephone Encounter (Signed)
Patient states prefers the thyroid med: Armour can you rewrite for this and wil dose change?

## 2018-04-11 ENCOUNTER — Encounter: Payer: Self-pay | Admitting: Family Medicine

## 2018-04-22 ENCOUNTER — Ambulatory Visit: Payer: BLUE CROSS/BLUE SHIELD | Admitting: Family Medicine

## 2018-04-22 ENCOUNTER — Encounter: Payer: Self-pay | Admitting: Family Medicine

## 2018-04-22 VITALS — BP 124/70 | HR 101 | Temp 99.0°F | Ht 65.0 in | Wt 176.7 lb

## 2018-04-22 DIAGNOSIS — Z23 Encounter for immunization: Secondary | ICD-10-CM | POA: Diagnosis not present

## 2018-04-22 DIAGNOSIS — E890 Postprocedural hypothyroidism: Secondary | ICD-10-CM

## 2018-04-22 DIAGNOSIS — E7800 Pure hypercholesterolemia, unspecified: Secondary | ICD-10-CM | POA: Diagnosis not present

## 2018-04-22 DIAGNOSIS — E119 Type 2 diabetes mellitus without complications: Secondary | ICD-10-CM | POA: Diagnosis not present

## 2018-04-22 DIAGNOSIS — J0100 Acute maxillary sinusitis, unspecified: Secondary | ICD-10-CM

## 2018-04-22 DIAGNOSIS — R5383 Other fatigue: Secondary | ICD-10-CM

## 2018-04-22 MED ORDER — METFORMIN HCL 500 MG PO TABS: 500.0000 mg | ORAL_TABLET | Freq: Two times a day (BID) | ORAL | 3 refills | Status: DC

## 2018-04-22 MED ORDER — FLUTICASONE PROPIONATE 50 MCG/ACT NA SUSP: 2.0000 | Freq: Every day | NASAL | 11 refills | Status: DC

## 2018-04-22 MED ORDER — SEMAGLUTIDE(0.25 OR 0.5MG/DOS) 2 MG/1.5ML ~~LOC~~ SOPN: 0.5000 mg | PEN_INJECTOR | SUBCUTANEOUS | 5 refills | Status: DC

## 2018-04-22 MED ORDER — AMOXICILLIN-POT CLAVULANATE 875-125 MG PO TABS: 1.0000 | ORAL_TABLET | Freq: Two times a day (BID) | ORAL | 0 refills | Status: AC

## 2018-04-22 MED ORDER — FLUCONAZOLE 150 MG PO TABS: 150.0000 mg | ORAL_TABLET | Freq: Once | ORAL | 0 refills | Status: AC

## 2018-04-22 NOTE — Patient Instructions (Signed)
You can pick up some vitamin B12 and take [phone] mcg daily Let's decrease your metformin Pick up some magnesium oxide 250 mg and take one a day Try to limit saturated fats in your diet (bologna, hot dogs, barbeque, cheeseburgers, hamburgers, steak, bacon, sausage, cheese, etc.) and get more fresh fruits, vegetables, and whole grains Check labs on or after October 10th

## 2018-04-22 NOTE — Progress Notes (Signed)
BP 124/70   Pulse (!) 101   Temp 99 F (37.2 C)   Ht 5\' 5"  (1.651 m)   Wt 176 lb 11.2 oz (80.2 kg)   SpO2 95%   BMI 29.40 kg/m    Subjective:    Patient ID: Susan Bates, female    DOB: 03/28/1956, 62 y.o.   MRN: [id number]  HPI: Susan Bates is a 62 y.o. female  Chief Complaint  Patient presents with  . Follow-up    HPI Patient is here for f/u  Type 2 diabetes; she is having nausea with the Ozempic, but blood sugar has gone down a lot; stopped the metformin on her own; lowest FSBS was 103, highest was 117; she found benefit with weight loss; feels more full; not always nauseated; no pain She is due for eye exam later this month; no new problems with vision No problems with feet  Lab Results  Component Value Date   HGBA1C 6.5 (H) 01/08/2018   High cholesterol; does not eat much fatty meats; taking statin, no complaints Lab Results  Component Value Date   CHOL 174 01/08/2018   HDL 38 (L) 01/08/2018   LDLCALC 99 01/08/2018   TRIG 185 (H) 01/08/2018   CHOLHDL 4.6 (H) 01/08/2018   Thyroid medicine Feeling tired; waking up after 4 hours and will be awake for 1-2 hours; we reviewed the labs scanned in and reviewed by our NP last month (I was out of the office)  She has some pressure over right cheek, over sinuses; would like refill of nasal spray; not sure if sinus infection  Depression screen [facility] 2/9 04/22/2018 01/15/2018 05/11/2017 09/27/2016 03/14/2016  Decreased Interest 1 1 0 0 0  Down, Depressed, Hopeless 2 3 0 1 0  PHQ - 2 Score 3 4 0 1 0  Altered sleeping 3 1 - - -  Tired, decreased energy 3 1 - - -  Change in appetite 0 1 - - -  Feeling bad or failure about yourself  0 1 - - -  Trouble concentrating 0 2 - - -  Moving slowly or fidgety/restless 0 0 - - -  Suicidal thoughts 0 0 - - -  PHQ-9 Score 9 10 - - -  Difficult doing work/chores Not difficult at all Not difficult at all - - -    Relevant past medical, surgical, family and social history  reviewed Past Medical History:  Diagnosis Date  . Anxiety   . Depression   . Diabetes mellitus without complication (HCC)   . High cholesterol   . Thyroid disease    Past Surgical History:  Procedure Laterality Date  . ABDOMINAL HYSTERECTOMY    . thyriodectomy    . TUBAL LIGATION     Family History  Problem Relation Age of Onset  . Hodgkin's lymphoma Father   . Cancer Sister   . Cervical cancer Sister   . Arthritis Maternal Aunt   . Breast cancer Neg Hx    Social History   Tobacco Use  . Smoking status: Never Smoker  . Smokeless tobacco: Never Used  Substance Use Topics  . Alcohol use: Yes    Comment: occasional  . Drug use: No    Interim medical history since last visit reviewed. Allergies and medications reviewed  Review of Systems Per HPI unless specifically indicated above     Objective:    BP 124/70   Pulse (!) 101   Temp 99 F (37.2 C)   Ht  5\' 5"  (1.651 m)   Wt 176 lb 11.2 oz (80.2 kg)   SpO2 95%   BMI 29.40 kg/m   Wt Readings from Last 3 Encounters:  04/22/18 176 lb 11.2 oz (80.2 kg)  01/15/18 181 lb 9.6 oz (82.4 kg)  05/11/17 180 lb 8 oz (81.9 kg)    Physical Exam  Constitutional: She appears well-developed and well-nourished. No distress.  HENT:  Head: Normocephalic and atraumatic.  Right Ear: Tympanic membrane and ear canal normal.  Left Ear: Tympanic membrane and ear canal normal.  Nose: No mucosal edema or rhinorrhea.  Mouth/Throat: No posterior oropharyngeal edema or posterior oropharyngeal erythema.  Eyes: EOM are normal. No scleral icterus.  Neck: No thyromegaly present.  Cardiovascular: Normal rate, regular rhythm and normal heart sounds.  No murmur heard. Pulmonary/Chest: Effort normal and breath sounds normal. No respiratory distress. She has no wheezes.  Abdominal: Soft. Bowel sounds are normal. She exhibits no distension.  Musculoskeletal: She exhibits no edema.  Neurological: She is alert.  Skin: Skin is warm and dry. She  is not diaphoretic. No pallor.  Psychiatric: She has a normal mood and affect. Her behavior is normal. Judgment and thought content normal.   Diabetic Foot Form - Detailed   Diabetic Foot Exam - detailed Diabetic Foot exam was performed with the following findings:  Yes 04/22/2018  2:14 PM  Visual Foot Exam completed.:  Yes  Pulse Foot Exam completed.:  Yes  Right Dorsalis Pedis:  Present Left Dorsalis Pedis:  Present  Sensory Foot Exam Completed.:  Yes Semmes-Weinstein Monofilament Test R Site 1-Great Toe:  Pos L Site 1-Great Toe:  Pos        Results for orders placed or performed in visit on 01/15/18  ACTH  Result Value Ref Range   ACTH 31.8 7.2 - 63.3 pg/mL  Cortisol  Result Value Ref Range   Cortisol 14.5 ug/dL  Microalbumin / creatinine urine ratio  Result Value Ref Range   Creatinine, Urine 17 (L) 20 - 275 mg/dL   Microalb, Ur 0.2 mg/dL   Microalb Creat Ratio 12 <30 mcg/mg creat      Assessment & Plan:   Problem List Items Addressed This Visit      Endocrine   Post-surgical hypothyroidism   Relevant Orders   TSH   T3, free   T4, free   DM type 2 (diabetes mellitus, type 2) (HCC) - Primary   Relevant Medications   metFORMIN (GLUCOPHAGE) 500 MG tablet   Semaglutide (OZEMPIC) 0.25 or 0.5 MG/DOSE SOPN   Other Relevant Orders   Urine Microalbumin w/creat. ratio     Other   Hypercholesteremia    Reviewed last lipids; will check labs in December with A1c; limit sat fats; continue statin; do not take statin for 3 days after taking diflucan       Other Visit Diagnoses    Need for influenza vaccination       given today   Relevant Orders   Flu Vaccine QUAD 6+ mos PF IM (Fluarix Quad PF) (Completed)   Other fatigue       may be related to the thyroid dose change; will check labs in October, orders given and she will go to [facility]; call me if she's not heard 1 week after lab draw   Relevant Orders   CBC with Differential/Platelet   Magnesium   Vitamin B12    Acute non-recurrent maxillary sinusitis       pt may have early sinus infection developing;  start ABX IF NEEDED (fever, pus, etc); otherwise, symptom relief only   Relevant Medications   fluticasone (FLONASE) 50 MCG/ACT nasal spray   amoxicillin-clavulanate (AUGMENTIN) 875-125 MG tablet   fluconazole (DIFLUCAN) 150 MG tablet       Follow up plan: Return in about 3 months (around 07/22/2018) for follow-up visit with Dr. Sherie Don.  An after-visit summary was printed and given to the patient at check-out.  Please see the patient instructions which may contain other information and recommendations beyond what is mentioned above in the assessment and plan.  Meds ordered this encounter  Medications  . metFORMIN (GLUCOPHAGE) 500 MG tablet    Sig: Take 1 tablet (500 mg total) by mouth 2 (two) times daily with a meal.    Dispense:  180 tablet    Refill:  3  . Semaglutide (OZEMPIC) 0.25 or 0.5 MG/DOSE SOPN    Sig: Inject 0.5 mg into the skin once a week.    Dispense:  2 pen    Refill:  5  . fluticasone (FLONASE) 50 MCG/ACT nasal spray    Sig: Place 2 sprays into both nostrils daily.    Dispense:  16 g    Refill:  11  . amoxicillin-clavulanate (AUGMENTIN) 875-125 MG tablet    Sig: Take 1 tablet by mouth 2 (two) times daily for 10 days.    Dispense:  20 tablet    Refill:  0  . fluconazole (DIFLUCAN) 150 MG tablet    Sig: Take 1 tablet (150 mg total) by mouth once for 1 dose. Do not take your simvastatin for 3 days    Dispense:  1 tablet    Refill:  0    Orders Placed This Encounter  Procedures  . Flu Vaccine QUAD 6+ mos PF IM (Fluarix Quad PF)  . TSH  . T3, free  . T4, free  . CBC with Differential/Platelet  . Magnesium  . Vitamin B12  . Urine Microalbumin w/creat. ratio

## 2018-04-22 NOTE — Assessment & Plan Note (Signed)
Reviewed last lipids; will check labs in December with A1c; limit sat fats; continue statin; do not take statin for 3 days after taking diflucan

## 2018-04-23 ENCOUNTER — Other Ambulatory Visit: Payer: Self-pay | Admitting: Family Medicine

## 2018-04-24 ENCOUNTER — Other Ambulatory Visit: Payer: Self-pay

## 2018-04-29 ENCOUNTER — Encounter: Payer: Self-pay | Admitting: Family Medicine

## 2018-04-29 MED ORDER — SEMAGLUTIDE(0.25 OR 0.5MG/DOS) 2 MG/1.5ML ~~LOC~~ SOPN: PEN_INJECTOR | SUBCUTANEOUS | 0 refills | Status: DC

## 2018-04-29 MED ORDER — PEN NEEDLES 31G X 6 MM MISC: 0 refills | Status: AC

## 2018-04-30 ENCOUNTER — Telehealth: Payer: Self-pay | Admitting: Family Medicine

## 2018-04-30 MED ORDER — SEMAGLUTIDE(0.25 OR 0.5MG/DOS) 2 MG/1.5ML ~~LOC~~ SOPN: PEN_INJECTOR | SUBCUTANEOUS | 0 refills | Status: DC

## 2018-04-30 NOTE — Telephone Encounter (Signed)
Yes, thank you for the correction Med list updated, new Rx sent I returned the call but was invited to get a free medical alert system and then a cruise and then no one ever picked up Please return call and let them know to use the new Rx that has been corrected, see new Rx

## 2018-04-30 NOTE — Telephone Encounter (Signed)
Can you please clarify, is it once a week for 4 weeks?

## 2018-04-30 NOTE — Telephone Encounter (Signed)
Copied from CRM [phone]. Topic: Quick Communication - See Telephone Encounter >> Apr 30, 2018  1:48 PM Arlyss Gandyichardson, Yuuki Skeens N, NT wrote: CRM for notification. See Telephone encounter for: 04/30/18. Fannie KneeSue with [facility] calling to request a callback in regards to getting clarification on the medication Semaglutide,0.25 or 0.5MG /DOS, (OZEMPIC, 0.25 OR 0.5 MG/DOSE,) 2 MG/1.5ML SOPN. CB#: [phone] Ref#: [id number]

## 2018-05-01 NOTE — Telephone Encounter (Signed)
Called and could not get through as well

## 2018-05-01 NOTE — Telephone Encounter (Signed)
Wrong office

## 2018-05-01 NOTE — Telephone Encounter (Signed)
Viviann SpareSteven- [facility] is calling  for updated advise the number is incorrect the correct contact number is [phone]  Ref # [id number]

## 2018-05-09 ENCOUNTER — Other Ambulatory Visit: Payer: Self-pay

## 2018-06-01 LAB — MAGNESIUM: Magnesium: 2.2 mg/dL (ref 1.6–2.3)

## 2018-06-01 LAB — T3, FREE: T3, Free: 2.9 pg/mL (ref 2.0–4.4)

## 2018-06-01 LAB — T4, FREE: Free T4: 0.72 ng/dL — ABNORMAL LOW (ref 0.82–1.77)

## 2018-06-01 LAB — TSH: TSH: 3.66 u[IU]/mL (ref 0.450–4.500)

## 2018-06-01 LAB — CBC WITH DIFFERENTIAL/PLATELET
BASOS: 2 %
Basophils Absolute: 0.1 10*3/uL (ref 0.0–0.2)
EOS (ABSOLUTE): 0.3 10*3/uL (ref 0.0–0.4)
Eos: 4 %
HEMOGLOBIN: 14.8 g/dL (ref 11.1–15.9)
Hematocrit: 43.7 % (ref 34.0–46.6)
IMMATURE GRANS (ABS): 0.1 10*3/uL (ref 0.0–0.1)
Immature Granulocytes: 1 %
LYMPHS: 26 %
Lymphocytes Absolute: 2 10*3/uL (ref 0.7–3.1)
MCH: 29.8 pg (ref 26.6–33.0)
MCHC: 33.9 g/dL (ref 31.5–35.7)
MCV: 88 fL (ref 79–97)
MONOCYTES: 6 %
Monocytes Absolute: 0.5 10*3/uL (ref 0.1–0.9)
NEUTROS ABS: 4.7 10*3/uL (ref 1.4–7.0)
Neutrophils: 61 %
Platelets: 366 10*3/uL (ref 150–450)
RBC: 4.97 x10E6/uL (ref 3.77–5.28)
RDW: 12.6 % (ref 12.3–15.4)
WBC: 7.8 10*3/uL (ref 3.4–10.8)

## 2018-06-01 LAB — VITAMIN B12: Vitamin B-12: 953 pg/mL (ref 232–1245)

## 2018-06-06 ENCOUNTER — Other Ambulatory Visit: Payer: Self-pay | Admitting: Family Medicine

## 2018-06-06 DIAGNOSIS — E039 Hypothyroidism, unspecified: Secondary | ICD-10-CM

## 2018-06-06 MED ORDER — THYROID 60 MG PO TABS: ORAL_TABLET | ORAL | 1 refills | Status: DC

## 2018-06-06 NOTE — Progress Notes (Signed)
Refills of thyroid med sent

## 2018-07-22 ENCOUNTER — Ambulatory Visit: Payer: BLUE CROSS/BLUE SHIELD | Admitting: Family Medicine

## 2018-07-24 ENCOUNTER — Other Ambulatory Visit: Payer: Self-pay | Admitting: Family Medicine

## 2018-07-24 NOTE — Telephone Encounter (Signed)
Lab Results  Component Value Date   ALT 13 01/08/2018   Lab Results  Component Value Date   CHOL 174 01/08/2018   HDL 38 (L) 01/08/2018   LDLCALC 99 01/08/2018   TRIG 185 (H) 01/08/2018   CHOLHDL 4.6 (H) 01/08/2018

## 2018-07-26 ENCOUNTER — Encounter: Payer: Self-pay | Admitting: Family Medicine

## 2018-07-26 ENCOUNTER — Ambulatory Visit: Payer: BLUE CROSS/BLUE SHIELD | Admitting: Family Medicine

## 2018-07-26 VITALS — BP 112/70 | HR 98 | Temp 98.7°F | Ht 65.0 in | Wt 174.9 lb

## 2018-07-26 DIAGNOSIS — E039 Hypothyroidism, unspecified: Secondary | ICD-10-CM | POA: Diagnosis not present

## 2018-07-26 DIAGNOSIS — E119 Type 2 diabetes mellitus without complications: Secondary | ICD-10-CM | POA: Diagnosis not present

## 2018-07-26 DIAGNOSIS — Z5181 Encounter for therapeutic drug level monitoring: Secondary | ICD-10-CM

## 2018-07-26 DIAGNOSIS — E669 Obesity, unspecified: Secondary | ICD-10-CM

## 2018-07-26 DIAGNOSIS — E890 Postprocedural hypothyroidism: Secondary | ICD-10-CM

## 2018-07-26 DIAGNOSIS — E7800 Pure hypercholesterolemia, unspecified: Secondary | ICD-10-CM

## 2018-07-26 LAB — COMPLETE METABOLIC PANEL WITH GFR
AG Ratio: 1.7 (calc) (ref 1.0–2.5)
ALT: 10 U/L (ref 6–29)
AST: 14 U/L (ref 10–35)
Albumin: 4.7 g/dL (ref 3.6–5.1)
Alkaline phosphatase (APISO): 57 U/L (ref 33–130)
BUN: 15 mg/dL (ref 7–25)
CALCIUM: 10 mg/dL (ref 8.6–10.4)
CO2: 29 mmol/L (ref 20–32)
CREATININE: 0.77 mg/dL (ref 0.50–0.99)
Chloride: 99 mmol/L (ref 98–110)
GFR, EST NON AFRICAN AMERICAN: 84 mL/min/{1.73_m2} (ref >=60)
GFR, Est African American: 97 mL/min/{1.73_m2} (ref >=60)
Globulin: 2.7 g/dL (calc) (ref 1.9–3.7)
Glucose, Bld: 80 mg/dL (ref 65–99)
Potassium: 4.4 mmol/L (ref 3.5–5.3)
Sodium: 136 mmol/L (ref 135–146)
TOTAL PROTEIN: 7.4 g/dL (ref 6.1–8.1)
Total Bilirubin: 0.4 mg/dL (ref 0.2–1.2)

## 2018-07-26 LAB — LIPID PANEL
CHOL/HDL RATIO: 3.9 (calc) (ref <5.0)
Cholesterol: 172 mg/dL (ref <200)
HDL: 44 mg/dL — AB (ref >50)
LDL CHOLESTEROL (CALC): 103 mg/dL — AB
Non-HDL Cholesterol (Calc): 128 mg/dL (calc) (ref <130)
TRIGLYCERIDES: 151 mg/dL — AB (ref <150)

## 2018-07-26 LAB — HEMOGLOBIN A1C
EAG (MMOL/L): 6.6 (calc)
Hgb A1c MFr Bld: 5.8 % of total Hgb — ABNORMAL HIGH (ref <5.7)
MEAN PLASMA GLUCOSE: 120 (calc)

## 2018-07-26 MED ORDER — THYROID 60 MG PO TABS: 60.0000 mg | ORAL_TABLET | Freq: Every day | ORAL | 1 refills | Status: DC

## 2018-07-26 MED ORDER — LEVOTHYROXINE SODIUM 25 MCG PO TABS: ORAL_TABLET | ORAL | 0 refills | Status: DC

## 2018-07-26 MED ORDER — FLUTICASONE PROPIONATE 50 MCG/ACT NA SUSP: 2.0000 | Freq: Every day | NASAL | 3 refills | Status: DC

## 2018-07-26 NOTE — Assessment & Plan Note (Signed)
Encouraged weight loss 

## 2018-07-26 NOTE — Progress Notes (Signed)
BP 112/70   Pulse 98   Temp 98.7 F (37.1 C) (Oral)   Ht 5\' 5"  (1.651 m)   Wt 174 lb 14.4 oz (79.3 kg)   SpO2 96%   BMI 29.10 kg/m    Subjective:    Patient ID: Susan Bates, female    DOB: [birth year], 62 y.o.   MRN: [id number]  HPI: Susan Bates is a 62 y.o. female  Chief Complaint  Patient presents with  . Follow-up    HPI Here for f/u  Type 2 diabetes No new problems with feet Checking FSBS a few times a week; lowest was 87, high was 121 in the last few weeks Last eye exam; [facility]  Optometrist Trying to watch a good diet She is eating less cheese and less sugar Lab Results  Component Value Date   HGBA1C 5.8 (H) 07/26/2018    Hypothyroidism; doing well with activity level and bowels; not quite as tired; some skin dryness and hair loss is ongoing; she is on the Armour thyroid; 7th day pill falls apart Previous TSH was 0.014; was taking all the pills together; now just taking alone before breakfast  Lab Results  Component Value Date   TSH 3.660 05/31/2018    High cholesterol; runs in the family; trying to limit fatty meats; could eat more whole grains; not many eggs; does eat some cheese but less   Depression screen [facility] 2/9 07/26/2018 04/22/2018 01/15/2018 05/11/2017 09/27/2016  Decreased Interest 0 1 1 0 0  Down, Depressed, Hopeless 0 2 3 0 1  PHQ - 2 Score 0 3 4 0 1  Altered sleeping 0 3 1 - -  Tired, decreased energy 0 3 1 - -  Change in appetite 0 0 1 - -  Feeling bad or failure about yourself  0 0 1 - -  Trouble concentrating 0 0 2 - -  Moving slowly or fidgety/restless 0 0 0 - -  Suicidal thoughts 0 0 0 - -  PHQ-9 Score 0 9 10 - -  Difficult doing work/chores Not difficult at all Not difficult at all Not difficult at all - -   Fall Risk  07/26/2018 04/22/2018 01/15/2018 05/11/2017 09/27/2016  Falls in the past year? 0 No Yes No No  Number falls in past yr: - - 1 - -  Injury with Fall? - - No - -    Relevant past medical, surgical, family and  social history reviewed Past Medical History:  Diagnosis Date  . Anxiety   . Depression   . Diabetes mellitus without complication (HCC)   . High cholesterol   . Thyroid disease    Past Surgical History:  Procedure Laterality Date  . ABDOMINAL HYSTERECTOMY    . thyriodectomy    . TUBAL LIGATION     Family History  Problem Relation Age of Onset  . Hodgkin's lymphoma Father   . Cancer Sister   . Cervical cancer Sister   . Arthritis Maternal Aunt   . Breast cancer Neg Hx    Social History   Tobacco Use  . Smoking status: Never Smoker  . Smokeless tobacco: Never Used  Substance Use Topics  . Alcohol use: Yes    Comment: occasional  . Drug use: No     [facility] Visit from 07/26/2018 in [facility]  AUDIT-C Score  1      Interim medical history since last visit reviewed. Allergies and medications reviewed  Review of Systems Per HPI  unless specifically indicated above     Objective:    BP 112/70   Pulse 98   Temp 98.7 F (37.1 C) (Oral)   Ht 5\' 5"  (1.651 m)   Wt 174 lb 14.4 oz (79.3 kg)   SpO2 96%   BMI 29.10 kg/m   Wt Readings from Last 3 Encounters:  07/26/18 174 lb 14.4 oz (79.3 kg)  04/22/18 176 lb 11.2 oz (80.2 kg)  01/15/18 181 lb 9.6 oz (82.4 kg)    Physical Exam Constitutional:      General: She is not in acute distress.    Appearance: She is well-developed. She is not diaphoretic.  HENT:     Head: Normocephalic and atraumatic.  Eyes:     General: No scleral icterus. Neck:     Thyroid: No thyromegaly.  Cardiovascular:     Rate and Rhythm: Normal rate and regular rhythm.     Heart sounds: Normal heart sounds. No murmur.  Pulmonary:     Effort: Pulmonary effort is normal. No respiratory distress.     Breath sounds: Normal breath sounds. No wheezing.  Abdominal:     General: Bowel sounds are normal. There is no distension.     Palpations: Abdomen is soft.  Skin:    General: Skin is warm and dry.     Coloration: Skin is  not pale.  Neurological:     Mental Status: She is alert.  Psychiatric:        Behavior: Behavior normal.        Thought Content: Thought content normal.        Judgment: Judgment normal.    Diabetic Foot Form - Detailed   Diabetic Foot Exam - detailed Diabetic Foot exam was performed with the following findings:  Yes 07/26/2018  3:36 PM  Visual Foot Exam completed.:  Yes  Pulse Foot Exam completed.:  Yes  Right Dorsalis Pedis:  Present Left Dorsalis Pedis:  Present  Sensory Foot Exam Completed.:  Yes Semmes-Weinstein Monofilament Test R Site 1-Great Toe:  Pos L Site 1-Great Toe:  Pos           Assessment & Plan:   Problem List Items Addressed This Visit      Endocrine   Post-surgical hypothyroidism    Decrease the Armour by 30 mg per week and start synthroid 75 mcg per week, continue the Armour 60 mg per day; recheck TSH and T4 in 6 weeks, call me sooner if needed      Relevant Medications   thyroid (ARMOUR THYROID) 60 MG tablet   levothyroxine (SYNTHROID) 25 MCG tablet   DM type 2 (diabetes mellitus, type 2) (HCC) - Primary    Foot exam by MD; eye exam due and pt will call to schedule; continue checking FSBS PRN with my blessing; healthy diet encouraged      Relevant Orders   Hemoglobin A1C (Completed)     Other   Obesity (BMI 30.0-34.9)    Encouraged weight loss      Medication monitoring encounter    Check liver and kidneys      Relevant Orders   COMPLETE METABOLIC PANEL WITH GFR (Completed)   Hypercholesteremia    Encouraged more whole grains; less saturated fats; she'll exercise more, build up gradually      Relevant Orders   Lipid panel (Completed)    Other Visit Diagnoses    Hypothyroidism, unspecified type       Relevant Medications   thyroid (ARMOUR THYROID) 60 MG  tablet   levothyroxine (SYNTHROID) 25 MCG tablet   Other Relevant Orders   TSH   T4, free       Follow up plan: Return in about 8 weeks (around 09/20/2018) for labs only; 6  months with Dr. Sherie DonLada.  An after-visit summary was printed and given to the patient at check-out.  Please see the patient instructions which may contain other information and recommendations beyond what is mentioned above in the assessment and plan.  Meds ordered this encounter  Medications  . thyroid (ARMOUR THYROID) 60 MG tablet    Sig: Take 1 tablet (60 mg total) by mouth daily before breakfast.    Dispense:  90 tablet    Refill:  1    Changing up; going to do 60 mg of Armour daily plus synthroid 25 mcg 3x a week  . levothyroxine (SYNTHROID) 25 MCG tablet    Sig: One by mouth three times a week (like Monday, Wednesday, Friday)    Dispense:  39 tablet    Refill:  0    3 month supply  . fluticasone (FLONASE) 50 MCG/ACT nasal spray    Sig: Place 2 sprays into both nostrils daily.    Dispense:  48 g    Refill:  3    Orders Placed This Encounter  Procedures  . Hemoglobin A1C  . COMPLETE METABOLIC PANEL WITH GFR  . Lipid panel  . TSH  . T4, free

## 2018-07-26 NOTE — Assessment & Plan Note (Signed)
Foot exam by MD; eye exam due and pt will call to schedule; continue checking FSBS PRN with my blessing; healthy diet encouraged

## 2018-07-26 NOTE — Assessment & Plan Note (Signed)
Decrease the Armour by 30 mg per week and start synthroid 75 mcg per week, continue the Armour 60 mg per day; recheck TSH and T4 in 6 weeks, call me sooner if needed

## 2018-07-26 NOTE — Assessment & Plan Note (Signed)
Encouraged more whole grains; less saturated fats; she'll exercise more, build up gradually

## 2018-07-26 NOTE — Patient Instructions (Addendum)
Let's do the new thyroid regimen We'll get labs today Return in 6 weeks after you start the new thyroid regimen   Preventing Unhealthy Weight Gain, Adult Staying at a healthy weight is important. When fat builds up in your body, you may become overweight or obese. These conditions put you at greater risk for developing certain health problems, such as heart disease, diabetes, sleeping problems, joint problems, and some cancers. Unhealthy weight gain is often the result of making unhealthy choices in what you eat. It is also a result of not getting enough exercise. You can make changes to your lifestyle to prevent obesity and stay as healthy as possible. What nutrition changes can be made? To maintain a healthy weight and prevent obesity:  Eat only as much as your body needs. To do this: ? Pay attention to signs that you are hungry or full. Stop eating as soon as you feel full. ? If you feel hungry, try drinking water first. Drink enough water so your urine is clear or pale yellow. ? Eat smaller portions. ? Look at serving sizes on food labels. Most foods contain more than one serving per container. ? Eat the recommended amount of calories for your gender and activity level. While most active people should eat around 2,000 calories per day, if you are trying to lose weight or are not very active, you main need to eat less calories. Talk to your health care provider or dietitian about how many calories you should eat each day.  Choose healthy foods, such as: ? Fruits and vegetables. Try to fill at least half of your plate at each meal with fruits and vegetables. ? Whole grains, such as whole wheat bread, brown rice, and quinoa. ? Lean meats, such as chicken or fish. ? Other healthy proteins, such as beans, eggs, or tofu. ? Healthy fats, such as nuts, seeds, fatty fish, and olive oil. ? Low-fat or fat-free dairy.  Check food labels and avoid food and drinks that: ? Are high in  calories. ? Have added sugar. ? Are high in sodium. ? Have saturated fats or trans fats.  Limit how much you eat of the following foods: ? Prepackaged meals. ? Fast food. ? Fried foods. ? Processed meat, such as bacon, sausage, and deli meats. ? Fatty cuts of red meat and poultry with skin.  Cook foods in healthier ways, such as by baking, broiling, or grilling.  When grocery shopping, try to shop around the outside of the store. This helps you buy mostly fresh foods and avoid canned and prepackaged foods.  What lifestyle changes can be made?  Exercise at least 30 minutes 5 or more days each week. Exercising includes brisk walking, yard work, biking, running, swimming, and team sports like basketball and soccer. Ask your health care provider which exercises are safe for you.  Do not use any products that contain nicotine or tobacco, such as cigarettes and e-cigarettes. If you need help quitting, ask your health care provider.  Limit alcohol intake to no more than 1 drink a day for nonpregnant women and 2 drinks a day for men. One drink equals 12 oz of beer, 5 oz of wine, or 1 oz of hard liquor.  Try to get 7-9 hours of sleep each night. What other changes can be made?  Keep a food and activity journal to keep track of: ? What you ate and how many calories you had. Remember to count sauces, dressings, and side dishes. ? Whether  you were active, and what exercises you did. ? Your calorie, weight, and activity goals.  Check your weight regularly. Track any changes. If you notice you have gained weight, make changes to your diet or activity routine.  Avoid taking weight-loss medicines or supplements. Talk to your health care provider before starting any new medicine or supplement.  Talk to your health care provider before trying any new diet or exercise plan. Why are these changes important? Eating healthy, staying active, and having healthy habits not only help prevent obesity,  they also:  Help you to manage stress and emotions.  Help you to connect with friends and family.  Improve your self-esteem.  Improve your sleep.  Prevent long-term health problems.  What can happen if changes are not made? Being obese or overweight can cause you to develop joint or bone problems, which can make it hard for you to stay active or do activities you enjoy. Being obese or overweight also puts stress on your heart and lungs and can lead to health problems like diabetes, heart disease, and some cancers. Where to find more information: Talk with your health care provider or a dietitian about healthy eating and healthy lifestyle choices. You may also find other information through these resources:  U.[place]. Department of Agriculture MyPlate: https://ball-collins.biz/  American Heart Association: www.heart.org  Centers for Disease Control and Prevention: FootballExhibition.com.br  Summary  Staying at a healthy weight is important. It helps prevent certain diseases and health problems, such as heart disease, diabetes, joint problems, sleep disorders, and some cancers.  Being obese or overweight can cause you to develop joint or bone problems, which can make it hard for you to stay active or do activities you enjoy.  You can prevent unhealthy weight gain by eating a healthy diet, exercising regularly, not smoking, limiting alcohol, and getting enough sleep.  Talk with your health care provider or a dietitian for guidance about healthy eating and healthy lifestyle choices. This information is not intended to replace advice given to you by your health care provider. Make sure you discuss any questions you have with your health care provider. Document Released: 08/01/2016 Document Revised: 09/06/2016 Document Reviewed: 09/06/2016 Elsevier Interactive Patient Education  [employer].

## 2018-07-26 NOTE — Assessment & Plan Note (Signed)
Check liver and kidneys 

## 2018-07-31 ENCOUNTER — Encounter: Payer: Self-pay | Admitting: Family Medicine

## 2018-07-31 MED ORDER — ROSUVASTATIN CALCIUM 20 MG PO TABS: 20.0000 mg | ORAL_TABLET | Freq: Every day | ORAL | 0 refills | Status: DC

## 2018-08-06 ENCOUNTER — Other Ambulatory Visit: Payer: Self-pay | Admitting: Family Medicine

## 2018-08-10 ENCOUNTER — Encounter: Payer: Self-pay | Admitting: Family Medicine

## 2018-10-05 LAB — T4, FREE: Free T4: 0.81 ng/dL — ABNORMAL LOW (ref 0.82–1.77)

## 2018-10-05 LAB — TSH: TSH: 1.97 u[IU]/mL (ref 0.450–4.500)

## 2018-10-06 ENCOUNTER — Other Ambulatory Visit: Payer: Self-pay | Admitting: Family Medicine

## 2018-10-06 DIAGNOSIS — E039 Hypothyroidism, unspecified: Secondary | ICD-10-CM

## 2018-10-06 MED ORDER — LEVOTHYROXINE SODIUM 25 MCG PO TABS: ORAL_TABLET | ORAL | 1 refills | Status: AC

## 2018-10-06 MED ORDER — THYROID 60 MG PO TABS: 60.0000 mg | ORAL_TABLET | Freq: Every day | ORAL | 1 refills | Status: DC

## 2018-10-06 NOTE — Progress Notes (Signed)
Refills of synthroid and armour to mail order for 6 months

## 2018-10-12 ENCOUNTER — Other Ambulatory Visit: Payer: Self-pay | Admitting: Family Medicine

## 2018-10-12 DIAGNOSIS — Z5181 Encounter for therapeutic drug level monitoring: Secondary | ICD-10-CM

## 2018-10-12 DIAGNOSIS — E7800 Pure hypercholesterolemia, unspecified: Secondary | ICD-10-CM

## 2018-10-13 NOTE — Telephone Encounter (Signed)
Lab Results  Component Value Date   ALT 10 07/26/2018   Lab Results  Component Value Date   CHOL 172 07/26/2018   HDL 44 (L) 07/26/2018   LDLCALC 103 (H) 07/26/2018   TRIG 151 (H) 07/26/2018   CHOLHDL 3.9 07/26/2018   Please ask the patient to have her LDL checked this week (I'll order lipids and ALT) I sent the Rx so she doesn't run out, and I hope we don't have to change the dose

## 2018-10-14 NOTE — Telephone Encounter (Signed)
Pt notified and stated she will come tomorrow morning.

## 2018-10-15 ENCOUNTER — Other Ambulatory Visit: Payer: Self-pay

## 2018-10-15 DIAGNOSIS — E038 Other specified hypothyroidism: Secondary | ICD-10-CM

## 2018-10-15 DIAGNOSIS — E7800 Pure hypercholesterolemia, unspecified: Secondary | ICD-10-CM

## 2018-10-15 DIAGNOSIS — Z5181 Encounter for therapeutic drug level monitoring: Secondary | ICD-10-CM

## 2018-10-15 LAB — LIPID PANEL
CHOL/HDL RATIO: 3.1 (calc) (ref <5.0)
CHOLESTEROL: 113 mg/dL (ref <200)
HDL: 36 mg/dL — AB (ref >=50)
LDL CHOLESTEROL (CALC): 54 mg/dL
Non-HDL Cholesterol (Calc): 77 mg/dL (calc) (ref <130)
Triglycerides: 143 mg/dL (ref <150)

## 2018-10-15 LAB — ALT: ALT: 11 U/L (ref 6–29)

## 2018-10-15 LAB — TSH: TSH: 1.66 mIU/L (ref 0.40–4.50)

## 2018-12-09 ENCOUNTER — Ambulatory Visit: Payer: Self-pay | Admitting: *Deleted

## 2018-12-09 NOTE — Telephone Encounter (Signed)
Patient calls with blister-like rash in a cluster on the right side of her back. Several are fluid filled several are scabbed over. She noticed a painful sensation last week then developed the blisters. She has been using calamine on the rash. States it mildly itches and rates the pain a 5 on the scale. No fever reported. She has been taking ibuprofen for comfort. She has not received the shingles vaccine. Patient aware a virtual visit may be needed. Smart phone #[phone]. Reviewed care advice per protocol. Routing encounter to PCP.  Reason for Disposition . [1] Shingles rash AND [2] onset > 72 hours ago  Answer Assessment - Initial Assessment Questions 1. APPEARANCE of RASH: "Describe the rash."      Rash-like blisters, some fluid filled on the right side of her back.  2. LOCATION: "Where is the rash located?"      Last week. 3. ONSET: "When did the rash start?"      Felt pain at the nerve endings on her back last week.  4. ITCHING: "Does the rash itch?" If so, ask: "How bad is the itch?"  (Scale 1-10; or mild, moderate, severe)     Slightly itches. 5. PAIN: "Does the rash hurt?" If so, ask: "How bad is the pain?"  (Scale 1-10; or mild, moderate, severe)     Yes, 5 on the pain scale. 6. OTHER SYMPTOMS: "Do you have any other symptoms?" (e.g., fever)     no 7. PREGNANCY: "Is there any chance you are pregnant?" "When was your last menstrual period?"    no  Protocols used: [facility]

## 2018-12-09 NOTE — Telephone Encounter (Signed)
Virtual appointment  

## 2018-12-10 ENCOUNTER — Other Ambulatory Visit: Payer: Self-pay

## 2018-12-10 ENCOUNTER — Encounter: Payer: Self-pay | Admitting: Nurse Practitioner

## 2018-12-10 ENCOUNTER — Ambulatory Visit (INDEPENDENT_AMBULATORY_CARE_PROVIDER_SITE_OTHER): Payer: BLUE CROSS/BLUE SHIELD | Admitting: Nurse Practitioner

## 2018-12-10 DIAGNOSIS — B029 Zoster without complications: Secondary | ICD-10-CM | POA: Diagnosis not present

## 2018-12-10 MED ORDER — VALACYCLOVIR HCL 1 G PO TABS: 1000.0000 mg | ORAL_TABLET | Freq: Three times a day (TID) | ORAL | 0 refills | Status: DC

## 2018-12-10 NOTE — Progress Notes (Signed)
Virtual Visit via Video Note  I connected with Susan Bates on 12/10/18 at  9:20 AM EDT by a video enabled telemedicine application and verified that I am speaking with the correct person using two identifiers.   Staff discussed the limitations of evaluation and management by telemedicine and the availability of in person appointments. The patient expressed understanding and agreed to proceed.  Patient location: car  My location: work office Other people present:  None  HPI  Patient endorses painful rash that started Thursday.  Pain described as pins and needles started before rash, and now rash is spreading. Rash in right side of back, states has one area that scabbed but new blisters appeared this morning.    Patient is taking ibuprofen and using calamine lotion with some relief.    PHQ2/9: Depression screen [facility] 2/9 12/10/2018 07/26/2018 04/22/2018 01/15/2018 05/11/2017  Decreased Interest 0 0 1 1 0  Down, Depressed, Hopeless 0 0 2 3 0  PHQ - 2 Score 0 0 3 4 0  Altered sleeping 0 0 3 1 -  Tired, decreased energy 0 0 3 1 -  Change in appetite 0 0 0 1 -  Feeling bad or failure about yourself  0 0 0 1 -  Trouble concentrating 0 0 0 2 -  Moving slowly or fidgety/restless 0 0 0 0 -  Suicidal thoughts 0 0 0 0 -  PHQ-9 Score 0 0 9 10 -  Difficult doing work/chores Not difficult at all Not difficult at all Not difficult at all Not difficult at all -     PHQ reviewed. Negative  Patient Active Problem List   Diagnosis Date Noted  . Obesity (BMI 30.0-34.9) 01/15/2018  . Abnormal breast finding 05/21/2017  . Cervical arthritis 05/11/2017  . Headache 09/27/2016  . DM type 2 (diabetes mellitus, type 2) (HCC) 10/01/2015  . Post-surgical hypothyroidism 10/01/2015  . Hypercholesteremia 10/01/2015  . Medication monitoring encounter 10/01/2015    Past Medical History:  Diagnosis Date  . Anxiety   . Depression   . Diabetes mellitus without complication (HCC)   . High cholesterol   .  Thyroid disease     Past Surgical History:  Procedure Laterality Date  . ABDOMINAL HYSTERECTOMY    . thyriodectomy    . TUBAL LIGATION      Social History   Tobacco Use  . Smoking status: Never Smoker  . Smokeless tobacco: Never Used  Substance Use Topics  . Alcohol use: Yes    Comment: occasional     Current Outpatient Medications:  .  aspirin 81 MG tablet, Take 81 mg by mouth daily., Disp: , Rfl:  .  Cholecalciferol (VITAMIN D-3) 1000 units CAPS, Take 1 capsule by mouth daily. , Disp: , Rfl:  .  finasteride (PROSCAR) 5 MG tablet, Take 5 mg by mouth daily., Disp: , Rfl:  .  Flaxseed, Linseed, (FLAXSEED OIL PO), Take 1 tablet by mouth daily. , Disp: , Rfl:  .  FLUoxetine (PROZAC) 40 MG capsule, Take 1 capsule (40 mg total) by mouth daily., Disp: 90 capsule, Rfl: 3 .  fluticasone (FLONASE) 50 MCG/ACT nasal spray, Place 2 sprays into both nostrils daily., Disp: 48 g, Rfl: 3 .  levothyroxine (SYNTHROID) 25 MCG tablet, One by mouth three times a week (like Monday, Wednesday, Friday), Disp: 39 tablet, Rfl: 1 .  magnesium 30 MG tablet, Take 30 mg by mouth daily., Disp: , Rfl:  .  meloxicam (MOBIC) 15 MG tablet, Take 1 tablet (15  mg total) by mouth daily. prn, Disp: 30 tablet, Rfl: 2 .  metFORMIN (GLUCOPHAGE) 500 MG tablet, Take 1 tablet (500 mg total) by mouth 2 (two) times daily with a meal., Disp: 180 tablet, Rfl: 3 .  Multiple Vitamin (MULTI-VITAMINS) TABS, Take by mouth daily., Disp: , Rfl:  .  Multiple Vitamins-Minerals (OCUVITE ADULT 50+) CAPS, Take by mouth., Disp: , Rfl:  .  rosuvastatin (CRESTOR) 20 MG tablet, TAKE 1 TABLET DAILY FOR CHOLESTEROL (REPLACES SIMVASTATIN), Disp: 90 tablet, Rfl: 0 .  Semaglutide,0.25 or 0.5MG /DOS, (OZEMPIC, 0.25 OR 0.5 MG/DOSE,) 2 MG/1.5ML SOPN, Inject 0.5 mg into the skin once a week., Disp: 12 pen, Rfl: 1 .  spironolactone (ALDACTONE) 25 MG tablet, Take 25 mg by mouth daily., Disp: , Rfl:  .  thyroid (ARMOUR THYROID) 60 MG tablet, Take 1 tablet  (60 mg total) by mouth daily before breakfast., Disp: 90 tablet, Rfl: 1 .  vitamin B-12 (CYANOCOBALAMIN) 1000 MCG tablet, Take 1,000 mcg by mouth daily., Disp: , Rfl:  .  glucose blood (ONE TOUCH ULTRA TEST) test strip, CHECK FINGERSTICK BLOOD SUGAR DAILY IF DESIRED; E11.9, LON 99 months, Disp: 100 each, Rfl: 3 .  Insulin Pen Needle (PEN NEEDLES) 31G X 6 MM MISC, For use with weekly injections, subcutaneously; LON 99 months, Disp: 30 each, Rfl: 0 .  ONETOUCH DELICA LANCETS FINE MISC, CHECK FINGERSTICK BLOOD SUGAR DAILY IF DESIRED; E11.9, LON 99 months, Disp: 100 each, Rfl: 3  Allergies  Allergen Reactions  . Fish Allergy   . Shellfish Allergy Itching    ROS    No other specific complaints in a complete review of systems (except as listed in HPI above).  Objective  There were no vitals filed for this visit.  There is no height or weight on file to calculate BMI.  Nursing Note and Vital Signs reviewed.  Physical Exam Skin:         Constitutional: Patient appears well-developed and well-nourished. No distress.  HENT: Head: Normocephalic and atraumatic. Pulmonary/Chest: Effort normal  Musculoskeletal: Normal range of motion,  Neurological:s he is alert and oriented to person, place, and time. speech is normal.  Skin: red linear rash with blisters Psychiatric: Patient has a normal mood and affect. behavior is normal. Judgment and thought content normal.    Assessment & Plan  1. Herpes zoster without complication - valACYclovir (VALTREX) 1000 MG tablet; Take 1 tablet (1,000 mg total) by mouth 3 (three) times daily.  Dispense: 21 tablet; Refill: 0  Can provide work note for one week.     Follow Up Instructions:   PRN  I discussed the assessment and treatment plan with the patient. The patient was provided an opportunity to ask questions and all were answered. The patient agreed with the plan and demonstrated an understanding of the instructions.   The patient was  advised to call back or seek an in-person evaluation if the symptoms worsen or if the condition fails to improve as anticipated.  I provided 12 minutes of non-face-to-face time during this encounter.   Cheryle HorsfallElizabeth E Isaac Lacson, NP

## 2018-12-26 ENCOUNTER — Other Ambulatory Visit: Payer: Self-pay | Admitting: Family Medicine

## 2019-01-11 ENCOUNTER — Other Ambulatory Visit: Payer: Self-pay | Admitting: Family Medicine

## 2019-01-27 ENCOUNTER — Ambulatory Visit: Payer: BLUE CROSS/BLUE SHIELD | Admitting: Family Medicine

## 2019-01-27 ENCOUNTER — Other Ambulatory Visit: Payer: Self-pay

## 2019-01-27 ENCOUNTER — Encounter: Payer: Self-pay | Admitting: Nurse Practitioner

## 2019-01-27 ENCOUNTER — Ambulatory Visit: Payer: BLUE CROSS/BLUE SHIELD | Admitting: Nurse Practitioner

## 2019-01-27 VITALS — BP 110/68 | HR 70 | Temp 98.1°F | Resp 14 | Ht 65.0 in | Wt 177.9 lb

## 2019-01-27 DIAGNOSIS — Z5181 Encounter for therapeutic drug level monitoring: Secondary | ICD-10-CM

## 2019-01-27 DIAGNOSIS — M47812 Spondylosis without myelopathy or radiculopathy, cervical region: Secondary | ICD-10-CM | POA: Diagnosis not present

## 2019-01-27 DIAGNOSIS — J302 Other seasonal allergic rhinitis: Secondary | ICD-10-CM

## 2019-01-27 DIAGNOSIS — H538 Other visual disturbances: Secondary | ICD-10-CM

## 2019-01-27 DIAGNOSIS — E890 Postprocedural hypothyroidism: Secondary | ICD-10-CM

## 2019-01-27 DIAGNOSIS — E119 Type 2 diabetes mellitus without complications: Secondary | ICD-10-CM

## 2019-01-27 DIAGNOSIS — E7800 Pure hypercholesterolemia, unspecified: Secondary | ICD-10-CM

## 2019-01-27 DIAGNOSIS — E039 Hypothyroidism, unspecified: Secondary | ICD-10-CM

## 2019-01-27 MED ORDER — THYROID 60 MG PO TABS: 60.0000 mg | ORAL_TABLET | Freq: Every day | ORAL | 1 refills | Status: AC

## 2019-01-27 NOTE — Progress Notes (Signed)
Name: Susan Bates   MRN: [id number]    DOB: [birth year]   Date:01/27/2019       Progress Note  Subjective  Chief Complaint  Chief Complaint  Patient presents with  . Follow-up    HPI  Diabetes Mellitus Patient is rx metformin 500mg  BID- states takes it maybe 3 times a week has a hard time remembering; is taking ozempic 0.5mg  weekly with no missed doses however. States with pandemic has not been exercising as much and has been snacking more. Patient endorses watery eyes with intermittent blurry vision with watery eyes- thinks it is allergy related. States she also intermittently has pressure behind eyes- none today.    Hyperlipidemia Patient rx rosuvastatin 20mg   Takes medications as prescribed with no missed doses a month.  Denies myalgias Lab Results  Component Value Date   CHOL 113 10/15/2018   HDL 36 (L) 10/15/2018   LDLCALC 54 10/15/2018   TRIG 143 10/15/2018   CHOLHDL 3.1 10/15/2018    Hypothyroidism Patient goes [facility]- and states they do blood work there was well and is getting hormonal pellets and they were managing thyroid but she switched over to Dr. Sherie DonLada managing it. She is taking synthroid 25 mcg three days a week, armour thyroid 60mg  daily. They are prescribing spironolactone and finesteride has been taking it for facial hair. She says she recently got her thyroid panel checked there and they recommended increasing synthroid but she wanted Dr. Sherie DonLada to do it, does not have labs today but will send them to us for adjustments.  Patient denies fatigue/palpitations, insomnia, constipation/diarrhea, hot/cold intolerances,   Lab Results  Component Value Date   TSH 1.66 10/15/2018     [facility] 01/27/2019 12/10/2018 07/26/2018 04/22/2018 01/15/2018  Decreased Interest 0 0 0 1 1  Down, Depressed, Hopeless 0 0 0 2 3  PHQ - 2 Score 0 0 0 3 4  Altered sleeping 0 0 0 3 1  Tired, decreased energy 0 0 0 3 1  Change in appetite 0 0 0 0 1  Feeling  bad or failure about yourself  0 0 0 0 1  Trouble concentrating 0 0 0 0 2  Moving slowly or fidgety/restless 0 0 0 0 0  Suicidal thoughts 0 0 0 0 0  PHQ-9 Score 0 0 0 9 10  Difficult doing work/chores Not difficult at all Not difficult at all Not difficult at all Not difficult at all Not difficult at all     PHQ reviewed. Negative  Patient Active Problem List   Diagnosis Date Noted  . Obesity (BMI 30.0-34.9) 01/15/2018  . Abnormal breast finding 05/21/2017  . Cervical arthritis 05/11/2017  . DM type 2 (diabetes mellitus, type 2) (HCC) 10/01/2015  . Post-surgical hypothyroidism 10/01/2015  . Hypercholesteremia 10/01/2015    Past Medical History:  Diagnosis Date  . Anxiety   . Depression   . Diabetes mellitus without complication (HCC)   . High cholesterol   . Thyroid disease     Past Surgical History:  Procedure Laterality Date  . ABDOMINAL HYSTERECTOMY    . thyriodectomy    . TUBAL LIGATION      Social History   Tobacco Use  . Smoking status: Never Smoker  . Smokeless tobacco: Never Used  Substance Use Topics  . Alcohol use: Yes    Comment: occasional     Current Outpatient Medications:  .  aspirin 81 MG tablet, Take 81 mg by mouth daily.,  Disp: , Rfl:  .  Cholecalciferol (VITAMIN D-3) 1000 units CAPS, Take 1 capsule by mouth daily. , Disp: , Rfl:  .  finasteride (PROSCAR) 5 MG tablet, Take 5 mg by mouth daily., Disp: , Rfl:  .  Flaxseed, Linseed, (FLAXSEED OIL PO), Take 1 tablet by mouth daily. , Disp: , Rfl:  .  FLUoxetine (PROZAC) 40 MG capsule, TAKE 1 CAPSULE DAILY (INCREASING DOSE), Disp: 90 capsule, Rfl: 3 .  fluticasone (FLONASE) 50 MCG/ACT nasal spray, Place 2 sprays into both nostrils daily., Disp: 48 g, Rfl: 3 .  levothyroxine (SYNTHROID) 25 MCG tablet, One by mouth three times a week (like Monday, Wednesday, Friday), Disp: 39 tablet, Rfl: 1 .  magnesium 30 MG tablet, Take 30 mg by mouth daily., Disp: , Rfl:  .  meloxicam (MOBIC) 15 MG tablet, Take 1  tablet (15 mg total) by mouth daily. prn, Disp: 30 tablet, Rfl: 2 .  metFORMIN (GLUCOPHAGE) 500 MG tablet, Take 1 tablet (500 mg total) by mouth 2 (two) times daily with a meal., Disp: 180 tablet, Rfl: 3 .  Multiple Vitamin (MULTI-VITAMINS) TABS, Take by mouth daily., Disp: , Rfl:  .  Multiple Vitamins-Minerals (OCUVITE ADULT 50+) CAPS, Take by mouth., Disp: , Rfl:  .  rosuvastatin (CRESTOR) 20 MG tablet, TAKE 1 TABLET DAILY FOR CHOLESTEROL (REPLACES SIMVASTATIN), Disp: 90 tablet, Rfl: 3 .  Semaglutide,0.25 or 0.5MG /DOS, (OZEMPIC, 0.25 OR 0.5 MG/DOSE,) 2 MG/1.5ML SOPN, Inject 0.5 mg into the skin once a week., Disp: 12 pen, Rfl: 1 .  spironolactone (ALDACTONE) 25 MG tablet, Take 25 mg by mouth daily., Disp: , Rfl:  .  thyroid (ARMOUR THYROID) 60 MG tablet, Take 1 tablet (60 mg total) by mouth daily before breakfast., Disp: 90 tablet, Rfl: 1 .  vitamin B-12 (CYANOCOBALAMIN) 1000 MCG tablet, Take 1,000 mcg by mouth daily., Disp: , Rfl:  .  glucose blood (ONE TOUCH ULTRA TEST) test strip, CHECK FINGERSTICK BLOOD SUGAR DAILY IF DESIRED, Disp: 100 each, Rfl: 3 .  Insulin Pen Needle (PEN NEEDLES) 31G X 6 MM MISC, For use with weekly injections, subcutaneously; LON 99 months, Disp: 30 each, Rfl: 0 .  Lancets (ONETOUCH DELICA PLUS LANCET30G) MISC, CHECK FINGERSTICK BLOOD SUGAR DAILY IF DESIRED, Disp: 100 each, Rfl: 3  Allergies  Allergen Reactions  . Fish Allergy   . Shellfish Allergy Itching    ROS   No other specific complaints in a complete review of systems (except as listed in HPI above).  Objective  Vitals:   01/27/19 1008  BP: 110/68  Pulse: 70  Resp: 14  Temp: 98.1 F (36.7 C)  TempSrc: Oral  SpO2: 98%  Weight: 177 lb 14.4 oz (80.7 kg)  Height: 5\' 5"  (1.651 m)    Body mass index is 29.6 kg/m.  Nursing Note and Vital Signs reviewed.  Physical Exam Vitals signs reviewed.  Constitutional:      Appearance: She is well-developed.  HENT:     Head: Normocephalic and  atraumatic.  Neck:     Musculoskeletal: Normal range of motion and neck supple.     Vascular: No carotid bruit.  Cardiovascular:     Heart sounds: Normal heart sounds.  Pulmonary:     Effort: Pulmonary effort is normal.     Breath sounds: Normal breath sounds.  Abdominal:     General: Bowel sounds are normal.     Palpations: Abdomen is soft.     Tenderness: There is no abdominal tenderness.  Musculoskeletal: Normal range of motion.  Skin:    General: Skin is warm and dry.     Capillary Refill: Capillary refill takes less than 2 seconds.  Neurological:     Mental Status: She is alert and oriented to person, place, and time.     GCS: GCS eye subscore is 4. GCS verbal subscore is 5. GCS motor subscore is 6.     Sensory: No sensory deficit.  Psychiatric:        Speech: Speech normal.        Behavior: Behavior normal.        Thought Content: Thought content normal.        Judgment: Judgment normal.        No results found for this or any previous visit (from the past 48 hour(s)).  Assessment & Plan  1. Type 2 diabetes mellitus without complication, without long-term current use of insulin (HCC) Due for eye appointment, continue meds consider increase GLP if needed  - HgB A1c - COMPLETE METABOLIC PANEL WITH GFR - Urine Microalbumin w/creat. ratio  2. Post-surgical hypothyroidism Will send results from other provider- consider adjusting synthroid based on results.  - thyroid (ARMOUR THYROID) 60 MG tablet; Take 1 tablet (60 mg total) by mouth daily before breakfast.  Dispense: 90 tablet; Refill: 1  3. Hypercholesteremia Continue statin   4. Cervical arthritis stable  5. Medication monitoring encounter - COMPLETE METABOLIC PANEL WITH GFR  6. Seasonal allergies flonase and OTC antihistamine   7. Blurry vision, bilateral Set up eye doctor appointment, maybe allergy related as associated with watery eyes- take antihistaine

## 2019-01-27 NOTE — Patient Instructions (Signed)
-   Please call your eye doctor to set up a routine appointment

## 2019-01-28 LAB — COMPLETE METABOLIC PANEL WITH GFR
AG Ratio: 1.8 (calc) (ref 1.0–2.5)
ALT: 12 U/L (ref 6–29)
AST: 17 U/L (ref 10–35)
Albumin: 4.4 g/dL (ref 3.6–5.1)
Alkaline phosphatase (APISO): 50 U/L (ref 37–153)
BUN: 12 mg/dL (ref 7–25)
CO2: 26 mmol/L (ref 20–32)
Calcium: 9.6 mg/dL (ref 8.6–10.4)
Chloride: 102 mmol/L (ref 98–110)
Creat: 0.84 mg/dL (ref 0.50–0.99)
GFR, Est African American: 87 mL/min/{1.73_m2} (ref >=60)
GFR, Est Non African American: 75 mL/min/{1.73_m2} (ref >=60)
Globulin: 2.4 g/dL (calc) (ref 1.9–3.7)
Glucose, Bld: 106 mg/dL — ABNORMAL HIGH (ref 65–99)
Potassium: 4.6 mmol/L (ref 3.5–5.3)
Sodium: 137 mmol/L (ref 135–146)
Total Bilirubin: 0.5 mg/dL (ref 0.2–1.2)
Total Protein: 6.8 g/dL (ref 6.1–8.1)

## 2019-01-28 LAB — HEMOGLOBIN A1C
Hgb A1c MFr Bld: 6.2 % of total Hgb — ABNORMAL HIGH (ref <5.7)
Mean Plasma Glucose: 131 (calc)
eAG (mmol/L): 7.3 (calc)

## 2019-01-28 LAB — MICROALBUMIN / CREATININE URINE RATIO
Creatinine, Urine: 47 mg/dL (ref 20–275)
Microalb Creat Ratio: 9 mcg/mg creat (ref <30)
Microalb, Ur: 0.4 mg/dL

## 2019-01-29 ENCOUNTER — Encounter: Payer: Self-pay | Admitting: Family Medicine

## 2019-03-25 ENCOUNTER — Other Ambulatory Visit: Payer: Self-pay | Admitting: Nurse Practitioner

## 2019-03-25 DIAGNOSIS — Z1231 Encounter for screening mammogram for malignant neoplasm of breast: Secondary | ICD-10-CM

## 2019-03-27 ENCOUNTER — Other Ambulatory Visit: Payer: Self-pay

## 2019-03-27 ENCOUNTER — Ambulatory Visit
Admission: RE | Admit: 2019-03-27 | Discharge: 2019-03-27 | Disposition: A | Discharge status: Home / Self Care | Payer: BC Managed Care – PPO | Source: Ambulatory Visit | Attending: Nurse Practitioner | Admitting: Nurse Practitioner

## 2019-03-27 DIAGNOSIS — Z1231 Encounter for screening mammogram for malignant neoplasm of breast: Secondary | ICD-10-CM | POA: Diagnosis not present

## 2019-04-17 ENCOUNTER — Other Ambulatory Visit: Payer: Self-pay | Admitting: Family Medicine

## 2019-07-14 ENCOUNTER — Ambulatory Visit
Admission: EM | Admit: 2019-07-14 | Discharge: 2019-07-14 | Disposition: A | Discharge status: Home / Self Care | Payer: BC Managed Care – PPO | Attending: Family Medicine | Admitting: Family Medicine

## 2019-07-14 ENCOUNTER — Other Ambulatory Visit: Payer: Self-pay

## 2019-07-14 DIAGNOSIS — J029 Acute pharyngitis, unspecified: Secondary | ICD-10-CM

## 2019-07-14 DIAGNOSIS — R05 Cough: Secondary | ICD-10-CM | POA: Diagnosis not present

## 2019-07-14 DIAGNOSIS — J069 Acute upper respiratory infection, unspecified: Secondary | ICD-10-CM

## 2019-07-14 DIAGNOSIS — R5383 Other fatigue: Secondary | ICD-10-CM

## 2019-07-14 DIAGNOSIS — R059 Cough, unspecified: Secondary | ICD-10-CM

## 2019-07-14 MED ORDER — HYDROCOD POLST-CPM POLST ER 10-8 MG/5ML PO SUER: 5.0000 mL | Freq: Two times a day (BID) | ORAL | 0 refills | Status: DC

## 2019-07-14 MED ORDER — BENZONATATE 200 MG PO CAPS: ORAL_CAPSULE | ORAL | 0 refills | Status: DC

## 2019-07-14 NOTE — Discharge Instructions (Signed)
Get plenty of rest.  Drink lots of fluids.  Use Tylenol or Motrin for fever aches or pain

## 2019-07-14 NOTE — ED Triage Notes (Addendum)
Pt. States her ears clogged, nasal congestion, cough, fatigue since yesterday. She states her cough is aggressive and she has developed a scratchy throat because of it.

## 2019-07-14 NOTE — ED Provider Notes (Signed)
[facility]    CSN: [id number] Arrival date & time: 07/14/19  1150      History   Chief Complaint Chief Complaint  Patient presents with  . Cough    HPI Susan Bates is a 63 y.o. female.   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  Cough is very aggressive.  She has a scratchy throat because the amount of coughing she is doing.  She has not had fever or chills.         Past Medical History:  Diagnosis Date  . Anxiety   . Depression   . Diabetes mellitus without complication ([place])   . High cholesterol   . Thyroid disease     Patient Active Problem List   Diagnosis Date Noted  . Obesity (BMI 30.0-34.9) 01/15/2018  . Abnormal breast finding 05/21/2017  . Cervical arthritis 05/11/2017  . DM type 2 (diabetes mellitus, type 2) ([place]) 10/01/2015  . Post-surgical hypothyroidism 10/01/2015  . Hypercholesteremia 10/01/2015    Past Surgical History:  Procedure Laterality Date  . ABDOMINAL HYSTERECTOMY    . thyriodectomy    . TUBAL LIGATION      OB History   No obstetric history on file.      Home Medications    Prior to Admission medications   Medication Sig Start Date End Date Taking? Authorizing Provider  aspirin 81 MG tablet Take 81 mg by mouth daily.    [provider]  benzonatate (TESSALON) 200 MG capsule Take one cap TID PRN cough 07/14/19   Lorin Picket, PA-C  chlorpheniramine-HYDROcodone [facility] ER) 10-8 MG/5ML SUER Take 5 mLs by mouth 2 (two) times daily. 07/14/19   Lorin Picket, PA-C  Cholecalciferol (VITAMIN D-3) 1000 units CAPS Take 1 capsule by mouth daily.     [provider]  finasteride (PROSCAR) 5 MG tablet Take 5 mg by mouth daily.    [provider]  Flaxseed, Linseed, (FLAXSEED OIL PO) Take 1 tablet by mouth daily.     [provider]  FLUoxetine (PROZAC) 40  MG capsule TAKE 1 CAPSULE DAILY (INCREASING DOSE) 12/26/18   Poulose, Bethel Born, NP  fluticasone (FLONASE) 50 MCG/ACT nasal spray Place 2 sprays into both nostrils daily. 07/26/18   Arnetha Courser, MD  glucose blood (ONE TOUCH ULTRA TEST) test strip CHECK FINGERSTICK BLOOD SUGAR DAILY IF DESIRED 12/26/18   Poulose, Bethel Born, NP  Insulin Pen Needle (PEN NEEDLES) 31G X 6 MM MISC For use with weekly injections, subcutaneously; LON 99 months 04/29/18   Arnetha Courser, MD  Lancets [facility] DELICA PLUS [id number]) MISC CHECK FINGERSTICK BLOOD SUGAR DAILY IF DESIRED 12/26/18   Fredderick Severance, NP  levothyroxine (SYNTHROID) 25 MCG tablet One by mouth three times a week (like Monday, Wednesday, Friday) 10/06/18   Lada, Satira Anis, MD  magnesium 30 MG tablet Take 30 mg by mouth daily.    [provider]  meloxicam (MOBIC) 15 MG tablet Take 1 tablet (15 mg total) by mouth daily. prn 09/29/16   Arnetha Courser, MD  metFORMIN (GLUCOPHAGE) 500 MG tablet TAKE 1 TABLET TWICE A DAY WITH MEALS 04/17/19   Delsa Grana, PA-C  Multiple Vitamin (MULTI-VITAMINS) TABS Take by mouth daily.    [provider]  Multiple Vitamins-Minerals ([place] 50+) CAPS Take by mouth.    [provider]  rosuvastatin (CRESTOR) 20 MG tablet TAKE 1  TABLET DAILY FOR CHOLESTEROL (REPLACES SIMVASTATIN) 01/11/19   Doren Custard, FNP  Semaglutide,0.25 or 0.5MG /DOS, (OZEMPIC, 0.25 OR 0.5 MG/DOSE,) 2 MG/1.5ML SOPN Inject 0.5 mg into the skin once a week. 08/06/18   Kerman Passey, MD  spironolactone (ALDACTONE) 25 MG tablet Take 25 mg by mouth daily. 08/03/16   [provider]  thyroid (ARMOUR THYROID) 60 MG tablet Take 1 tablet (60 mg total) by mouth daily before breakfast. 01/27/19   Poulose, Percell Belt, NP  vitamin B-12 (CYANOCOBALAMIN) 1000 MCG tablet Take 1,000 mcg by mouth daily.    [provider]    Family History Family History  Problem Relation Age of Onset  . Stroke Mother   .  Hodgkin's lymphoma Father   . Cancer Sister   . Cervical cancer Sister   . Arthritis Maternal Aunt   . Breast cancer Neg Hx     Social History Social History   Tobacco Use  . Smoking status: Never Smoker  . Smokeless tobacco: Never Used  Substance Use Topics  . Alcohol use: Yes    Comment: occasional  . Drug use: No     Allergies   Fish allergy and Shellfish allergy   Review of Systems Review of Systems  Constitutional: Positive for activity change and fatigue. Negative for appetite change, chills and fever.  HENT: Positive for congestion, rhinorrhea and sore throat.   Respiratory: Positive for cough. Negative for shortness of breath and wheezing.   All other systems reviewed and are negative.    Physical Exam Triage Vital Signs ED Triage Vitals  Enc Vitals Group     BP 07/14/19 1256 125/85     Pulse Rate 07/14/19 1256 79     Resp 07/14/19 1256 18     Temp 07/14/19 1256 97.9 F (36.6 C)     Temp Source 07/14/19 1256 Oral     SpO2 07/14/19 1256 96 %     Weight 07/14/19 1250 178 lb (80.7 kg)     Height --      Head Circumference --      Peak Flow --      Pain Score 07/14/19 1250 0     Pain Loc --      Pain Edu? --      Excl. in GC? --    No data found.  Updated Vital Signs BP 125/85 (BP Location: Left Arm)   Pulse 79   Temp 97.9 F (36.6 C) (Oral)   Resp 18   Wt 178 lb (80.7 kg)   SpO2 96%   BMI 29.62 kg/m   Visual Acuity Right Eye Distance:   Left Eye Distance:   Bilateral Distance:    Right Eye Near:   Left Eye Near:    Bilateral Near:     Physical Exam Vitals signs and nursing note reviewed.  Constitutional:      General: She is not in acute distress.    Appearance: Normal appearance. She is normal weight. She is not ill-appearing or toxic-appearing.  HENT:     Head: Normocephalic and atraumatic.     Nose: Congestion present.     Mouth/Throat:     Mouth: Mucous membranes are moist.  Eyes:     Conjunctiva/sclera: Conjunctivae  normal.  Neck:     Musculoskeletal: Normal range of motion and neck supple.  Cardiovascular:     Rate and Rhythm: Normal rate and regular rhythm.     Heart sounds: Normal heart sounds.  Pulmonary:  Effort: Pulmonary effort is normal. No respiratory distress.     Breath sounds: Normal breath sounds. No stridor. No wheezing, rhonchi or rales.  Musculoskeletal: Normal range of motion.  Skin:    General: Skin is warm and dry.  Neurological:     General: No focal deficit present.     Mental Status: She is alert and oriented to person, place, and time.  Psychiatric:        Mood and Affect: Mood normal.        Behavior: Behavior normal.        Thought Content: Thought content normal.        Judgment: Judgment normal.      UC Treatments / Results  Labs (all labs ordered are listed, but only abnormal results are displayed) Labs Reviewed  NOVEL CORONAVIRUS, NAA (HOSP ORDER, SEND-OUT TO REF LAB; TAT 18-24 HRS)    EKG   Radiology No results found.  Procedures Procedures (including critical care time)  Medications Ordered in UC Medications - No data to display  Initial Impression / Assessment and Plan / UC Course  I have reviewed the triage vital signs and the nursing notes.  Pertinent labs & imaging results that were available during my care of the patient were reviewed by me and considered in my medical decision making (see chart for details).   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  Does have a predominant cough.  O2 sats on room air were 96% but lung examination was benign.  Covid testing was obtained for PCR.  In the meantime she will quarantine until results are available.  If she worsens she should go immediately to the emergency room.  I treated her symptoms with Tussionex and Tessalon Perles.  She should rest as much as possible drink fluids use Tylenol or Motrin for fatigue body aches.   Final Clinical Impressions(s) / UC Diagnoses    Final diagnoses:  Viral upper respiratory tract infection  Cough     Discharge Instructions     Get plenty of rest.  Drink lots of fluids.  Use Tylenol or Motrin for fever aches or pain   ED Prescriptions    Medication Sig Dispense Auth. Provider   chlorpheniramine-HYDROcodone (TUSSIONEX PENNKINETIC ER) 10-8 MG/5ML SUER Take 5 mLs by mouth 2 (two) times daily. 115 mL Ovid Curdoemer, Belina Mandile P, PA-C   benzonatate (TESSALON) 200 MG capsule Take one cap TID PRN cough 30 capsule Lutricia Feiloemer, Felicity Penix P, PA-C     PDMP not reviewed this encounter.   Lutricia FeilRoemer, Myiesha Edgar P, PA-C 07/14/19 2110

## 2019-07-16 LAB — NOVEL CORONAVIRUS, NAA (HOSP ORDER, SEND-OUT TO REF LAB; TAT 18-24 HRS): SARS-CoV-2, NAA: NOT DETECTED

## 2019-07-29 ENCOUNTER — Ambulatory Visit (INDEPENDENT_AMBULATORY_CARE_PROVIDER_SITE_OTHER): Payer: BC Managed Care – PPO | Admitting: Family Medicine

## 2019-07-29 ENCOUNTER — Encounter: Payer: Self-pay | Admitting: Family Medicine

## 2019-07-29 VITALS — Ht 65.0 in | Wt 178.0 lb

## 2019-07-29 DIAGNOSIS — F321 Major depressive disorder, single episode, moderate: Secondary | ICD-10-CM

## 2019-07-29 DIAGNOSIS — E890 Postprocedural hypothyroidism: Secondary | ICD-10-CM | POA: Diagnosis not present

## 2019-07-29 DIAGNOSIS — E7800 Pure hypercholesterolemia, unspecified: Secondary | ICD-10-CM | POA: Diagnosis not present

## 2019-07-29 DIAGNOSIS — Z5181 Encounter for therapeutic drug level monitoring: Secondary | ICD-10-CM

## 2019-07-29 DIAGNOSIS — E119 Type 2 diabetes mellitus without complications: Secondary | ICD-10-CM

## 2019-07-29 DIAGNOSIS — Z760 Encounter for issue of repeat prescription: Secondary | ICD-10-CM

## 2019-07-29 MED ORDER — FLUOXETINE HCL 40 MG PO CAPS: 40.0000 mg | ORAL_CAPSULE | Freq: Every day | ORAL | 3 refills | Status: AC

## 2019-07-29 MED ORDER — OZEMPIC (0.25 OR 0.5 MG/DOSE) 2 MG/1.5ML ~~LOC~~ SOPN: 0.5000 mg | PEN_INJECTOR | SUBCUTANEOUS | 1 refills | Status: DC

## 2019-07-29 MED ORDER — ROSUVASTATIN CALCIUM 20 MG PO TABS: 20.0000 mg | ORAL_TABLET | Freq: Every day | ORAL | 3 refills | Status: AC

## 2019-07-29 MED ORDER — TIZANIDINE HCL 2 MG PO CAPS: 2.0000 mg | ORAL_CAPSULE | Freq: Three times a day (TID) | ORAL | 1 refills | Status: AC | PRN

## 2019-07-29 NOTE — Progress Notes (Signed)
Name: Susan Bates   MRN: [id number]    DOB: [birth year]   Date:07/29/2019       Progress Note  Subjective:    Chief Complaint  Chief Complaint  Patient presents with  . Follow-up  . Depression  . Diabetes    last checked couple of days ago 108  . Hyperlipidemia    I connected with  Susan Bates  on 07/29/19 at  9:00 AM EST by a video enabled telemedicine application and verified that I am speaking with the correct person using two identifiers.  I discussed the limitations of evaluation and management by telemedicine and the availability of in person appointments. The patient expressed understanding and agreed to proceed. Staff also discussed with the patient that there may be a patient responsible charge related to this service. Patient Location: home Provider Location: [facility] Additional Individuals present: none  HPI  Diabetes Mellitus Type II: Currently managing with ozempic on 0.5 mg dose weekly and metformin 500 mg bid Pt notes ozempic has run out, she tolerated w/o concerns, believes she was on 0.5 mg dose Pt has no SE from meds. Fasting CBGs typically run 100-130, She is checking a few times a week.   Recent high 154  and low CBG 108.  No hypoglycemic episodes Denies: Polyuria, polydipsia, polyphagia, vision changes, or neuropathy  Recent pertinent labs: Lab Results  Component Value Date   HGBA1C 6.2 (H) 01/27/2019   HGBA1C 5.8 (H) 07/26/2018   HGBA1C 6.5 (H) 01/08/2018      Component Value Date/Time   NA 137 01/27/2019 1049   NA 136 01/08/2018 0929   K 4.6 01/27/2019 1049   CL 102 01/27/2019 1049   CO2 26 01/27/2019 1049   GLUCOSE 106 (H) 01/27/2019 1049   BUN 12 01/27/2019 1049   BUN 12 01/08/2018 0929   CREATININE 0.84 01/27/2019 1049   CALCIUM 9.6 01/27/2019 1049   PROT 6.8 01/27/2019 1049   PROT 7.1 01/08/2018 0929   ALBUMIN 4.6 01/08/2018 0929   AST 17 01/27/2019 1049   ALT 12 01/27/2019 1049   ALKPHOS 64 01/08/2018 0929   BILITOT 0.5  01/27/2019 1049   BILITOT 0.2 01/08/2018 0929   GFRNONAA 75 01/27/2019 1049   GFRAA 87 01/27/2019 1049   Current diet: in general, a "healthy" diet   Current exercise: none Pt due for DM foot exam and eye exam ACEI/ARB: No Statin: Yes  Hypothyroidism:  [facility] History: surgical hypothyroid Current Medication Regimen: 25 mcg synthroid M, W, F and daily armour thyroid 60 mg  Current Symptoms: denies fatigue, weight changes, heat/cold intolerance, bowel/skin changes or CVS symptoms Most recent results are below, she has more recent labs from [facility] - she is looking for paper at home right now, can bring it by - TSH 1.530, T3 free 2.9, T4 free direct 0.96,  Lab Results  Component Value Date   TSH 1.66 10/15/2018   HLD- on crestor 20 mg, had changed from simvistatin earlier this year, tolerating dose change w/o se or concerns.  MDD-  On prozac 40 mg, states mood is good but she is frustrated with her weight Depression screen [facility] 2/9 07/29/2019 01/27/2019 12/10/2018  Decreased Interest 0 0 0  Down, Depressed, Hopeless 0 0 0  PHQ - 2 Score 0 0 0  Altered sleeping 0 0 0  Tired, decreased energy 0 0 0  Change in appetite 0 0 0  Feeling bad or failure about yourself  0 0 0  Trouble concentrating 0 0 0  Moving slowly or fidgety/restless 0 0 0  Suicidal thoughts 0 0 0  PHQ-9 Score 0 0 0  Difficult doing work/chores Not difficult at all Not difficult at all Not difficult at all   Diet trying to reduce carbs and avoid cheese      Patient Active Problem List   Diagnosis Date Noted  . Obesity (BMI 30.0-34.9) 01/15/2018  . Abnormal breast finding 05/21/2017  . Cervical arthritis 05/11/2017  . DM type 2 (diabetes mellitus, type 2) (HCC) 10/01/2015  . Post-surgical hypothyroidism 10/01/2015  . Hypercholesteremia 10/01/2015    Past Surgical History:  Procedure Laterality Date  . ABDOMINAL HYSTERECTOMY    . thyriodectomy    . TUBAL LIGATION      Family History  Problem  Relation Age of Onset  . Stroke Mother   . Hodgkin's lymphoma Father   . Cancer Sister   . Cervical cancer Sister   . Arthritis Maternal Aunt   . Breast cancer Neg Hx     Social History   Socioeconomic History  . Marital status: Married    Spouse name: Not on file  . Number of children: Not on file  . Years of education: Not on file  . Highest education level: Not on file  Occupational History  . Not on file  Tobacco Use  . Smoking status: Never Smoker  . Smokeless tobacco: Never Used  Substance and Sexual Activity  . Alcohol use: Yes    Comment: occasional  . Drug use: No  . Sexual activity: Yes  Other Topics Concern  . Not on file  Social History Narrative  . Not on file   Social Determinants of Health   Financial Resource Strain:   . Difficulty of Paying Living Expenses: Not on file  Food Insecurity:   . Worried About Programme researcher, broadcasting/film/video in the Last Year: Not on file  . Ran Out of Food in the Last Year: Not on file  Transportation Needs:   . Lack of Transportation (Medical): Not on file  . Lack of Transportation (Non-Medical): Not on file  Physical Activity:   . Days of Exercise per Week: Not on file  . Minutes of Exercise per Session: Not on file  Stress:   . Feeling of Stress : Not on file  Social Connections:   . Frequency of Communication with Friends and Family: Not on file  . Frequency of Social Gatherings with Friends and Family: Not on file  . Attends Religious Services: Not on file  . Active Member of Clubs or Organizations: Not on file  . Attends Banker Meetings: Not on file  . Marital Status: Not on file  Intimate Partner Violence:   . Fear of Current or Ex-Partner: Not on file  . Emotionally Abused: Not on file  . Physically Abused: Not on file  . Sexually Abused: Not on file     Current Outpatient Medications:  .  aspirin 81 MG tablet, Take 81 mg by mouth daily., Disp: , Rfl:  .  Cholecalciferol (VITAMIN D-3) 1000 units  CAPS, Take 1 capsule by mouth daily. , Disp: , Rfl:  .  Flaxseed, Linseed, (FLAXSEED OIL PO), Take 1 tablet by mouth daily. , Disp: , Rfl:  .  FLUoxetine (PROZAC) 40 MG capsule, TAKE 1 CAPSULE DAILY (INCREASING DOSE), Disp: 90 capsule, Rfl: 3 .  fluticasone (FLONASE) 50 MCG/ACT nasal spray, Place 2 sprays into both nostrils daily.,  Disp: 48 g, Rfl: 3 .  levothyroxine (SYNTHROID) 25 MCG tablet, One by mouth three times a week (like Monday, Wednesday, Friday), Disp: 39 tablet, Rfl: 1 .  magnesium 30 MG tablet, Take 30 mg by mouth daily., Disp: , Rfl:  .  meloxicam (MOBIC) 15 MG tablet, Take 1 tablet (15 mg total) by mouth daily. prn, Disp: 30 tablet, Rfl: 2 .  metFORMIN (GLUCOPHAGE) 500 MG tablet, TAKE 1 TABLET TWICE A DAY WITH MEALS, Disp: 180 tablet, Rfl: 3 .  Multiple Vitamin (MULTI-VITAMINS) TABS, Take by mouth daily., Disp: , Rfl:  .  Multiple Vitamins-Minerals (OCUVITE ADULT 50+) CAPS, Take by mouth., Disp: , Rfl:  .  rosuvastatin (CRESTOR) 20 MG tablet, TAKE 1 TABLET DAILY FOR CHOLESTEROL (REPLACES SIMVASTATIN), Disp: 90 tablet, Rfl: 3 .  Semaglutide,0.25 or 0.5MG /DOS, (OZEMPIC, 0.25 OR 0.5 MG/DOSE,) 2 MG/1.5ML SOPN, Inject 0.5 mg into the skin once a week., Disp: 12 pen, Rfl: 1 .  spironolactone (ALDACTONE) 25 MG tablet, Take 25 mg by mouth daily., Disp: , Rfl:  .  thyroid (ARMOUR THYROID) 60 MG tablet, Take 1 tablet (60 mg total) by mouth daily before breakfast., Disp: 90 tablet, Rfl: 1 .  vitamin B-12 (CYANOCOBALAMIN) 1000 MCG tablet, Take 1,000 mcg by mouth daily., Disp: , Rfl:  .  glucose blood (ONE TOUCH ULTRA TEST) test strip, CHECK FINGERSTICK BLOOD SUGAR DAILY IF DESIRED, Disp: 100 each, Rfl: 3 .  Insulin Pen Needle (PEN NEEDLES) 31G X 6 MM MISC, For use with weekly injections, subcutaneously; LON 99 months, Disp: 30 each, Rfl: 0 .  Lancets (ONETOUCH DELICA PLUS LANCET30G) MISC, CHECK FINGERSTICK BLOOD SUGAR DAILY IF DESIRED, Disp: 100 each, Rfl: 3  Allergies  Allergen Reactions  .  Fish Allergy   . Shellfish Allergy Itching    I personally reviewed active problem list, medication list, allergies, family history, social history, health maintenance, notes from last encounter, lab results, imaging with the patient/caregiver today.  Review of Systems  Constitutional: Negative.   HENT: Negative.   Eyes: Negative.   Respiratory: Negative.   Cardiovascular: Negative.   Gastrointestinal: Negative.   Endocrine: Negative.   Genitourinary: Negative.   Musculoskeletal: Negative.   Skin: Negative.   Allergic/Immunologic: Negative.   Neurological: Negative.   Hematological: Negative.   Psychiatric/Behavioral: Negative.   All other systems reviewed and are negative.     Objective:    Virtual encounter, vitals limited, only able to obtain the following Today's Vitals   07/29/19 0931  Weight: 178 lb (80.7 kg)  Height: 5\' 5"  (1.651 m)   Body mass index is 29.62 kg/m. Nursing Note and Vital Signs reviewed.  Physical Exam Vitals and nursing note reviewed.  Constitutional:      General: She is not in acute distress.    Appearance: She is well-developed. She is obese. She is not ill-appearing, toxic-appearing or diaphoretic.  HENT:     Head: Normocephalic and atraumatic.     Nose: Nose normal.  Eyes:     General:        Right eye: No discharge.        Left eye: No discharge.     Conjunctiva/sclera: Conjunctivae normal.  Neck:     Trachea: No tracheal deviation.  Pulmonary:     Effort: Pulmonary effort is normal. No respiratory distress.     Breath sounds: No stridor.  Skin:    Coloration: Skin is not jaundiced or pale.     Findings: No rash.  Neurological:  Mental Status: She is alert.     Motor: No abnormal muscle tone.     Coordination: Coordination normal.  Psychiatric:        Mood and Affect: Mood normal.        Behavior: Behavior normal.     PE limited by telephone encounter  No results found for this or any previous visit (from the past  72 hour(s)).  PHQ2/9: [facility] 07/29/2019 01/27/2019 12/10/2018 07/26/2018 04/22/2018  Decreased Interest 0 0 0 0 1  Down, Depressed, Hopeless 0 0 0 0 2  PHQ - 2 Score 0 0 0 0 3  Altered sleeping 0 0 0 0 3  Tired, decreased energy 0 0 0 0 3  Change in appetite 0 0 0 0 0  Feeling bad or failure about yourself  0 0 0 0 0  Trouble concentrating 0 0 0 0 0  Moving slowly or fidgety/restless 0 0 0 0 0  Suicidal thoughts 0 0 0 0 0  PHQ-9 Score 0 0 0 0 9  Difficult doing work/chores Not difficult at all Not difficult at all Not difficult at all Not difficult at all Not difficult at all   PHQ-2/9 Result is negative, reviewed today  Fall Risk: Fall Risk  07/29/2019 01/27/2019 12/10/2018 07/26/2018 04/22/2018  Falls in the past year? 0 - 0 0 No  Number falls in past yr: 0 0 - - -  Injury with Fall? 0 - - - -     Assessment and Plan:       ICD-10-CM   1. Type 2 diabetes mellitus without complication, without long-term current use of insulin (HCC)  E11.9 Semaglutide,0.25 or 0.5MG /DOS, (OZEMPIC, 0.25 OR 0.5 MG/DOSE,) 2 MG/1.5ML SOPN    CMP w GFR    Lipid Panel    A1C   recheck labs, refill on ozempic - may be able to increase dose, pt needs foot exam  2. Post-surgical hypothyroidism  E89.0    on 25 mcg, recheck labs  3. Hypercholesteremia  E78.00 rosuvastatin (CRESTOR) 20 MG tablet    CMP w GFR    Lipid Panel    A1C   tolerating crestor 20 mg w/o se or concerns, recheck labs  4. Current moderate episode of major depressive disorder, unspecified whether recurrent (HCC)  F32.1 FLUoxetine (PROZAC) 40 MG capsule   mood/depression well managed with prozac, no concerns or med SE, phq negative today  5. Medication monitoring encounter  Z51.81 CBC w/ Diff    CMP w GFR    Lipid Panel    A1C  6. Medication refill  Z76.0 tizanidine (ZANAFLEX) 2 MG capsule   refill on muscle relaxers for intermittent muscle spasms     I discussed the assessment and treatment plan with the  patient. The patient was provided an opportunity to ask questions and all were answered. The patient agreed with the plan and demonstrated an understanding of the instructions.  The patient was advised to call back or seek an in-person evaluation if the symptoms worsen or if the condition fails to improve as anticipated.  I provided 18 minutes of non-face-to-face time during this encounter.  Danelle BerryLeisa Carmella Kees, PA-C 12/15/209:44 AM

## 2019-08-05 DIAGNOSIS — F321 Major depressive disorder, single episode, moderate: Secondary | ICD-10-CM | POA: Insufficient documentation

## 2019-08-27 LAB — CBC WITH DIFFERENTIAL/PLATELET
Absolute Monocytes: 621 cells/uL (ref 200–950)
Basophils Absolute: 102 cells/uL (ref 0–200)
Basophils Relative: 1.2 %
Eosinophils Absolute: 247 cells/uL (ref 15–500)
Eosinophils Relative: 2.9 %
HCT: 42.8 % (ref 35.0–45.0)
Hemoglobin: 14.8 g/dL (ref 11.7–15.5)
Lymphs Abs: 1828 cells/uL (ref 850–3900)
MCH: 30.1 pg (ref 27.0–33.0)
MCHC: 34.6 g/dL (ref 32.0–36.0)
MCV: 87.2 fL (ref 80.0–100.0)
MPV: 10.2 fL (ref 7.5–12.5)
Monocytes Relative: 7.3 %
Neutro Abs: 5704 cells/uL (ref 1500–7800)
Neutrophils Relative %: 67.1 %
Platelets: 365 10*3/uL (ref 140–400)
RBC: 4.91 10*6/uL (ref 3.80–5.10)
RDW: 12 % (ref 11.0–15.0)
Total Lymphocyte: 21.5 %
WBC: 8.5 10*3/uL (ref 3.8–10.8)

## 2019-08-27 LAB — COMPLETE METABOLIC PANEL WITH GFR
AG Ratio: 1.8 (calc) (ref 1.0–2.5)
ALT: 12 U/L (ref 6–29)
AST: 14 U/L (ref 10–35)
Albumin: 4.7 g/dL (ref 3.6–5.1)
Alkaline phosphatase (APISO): 52 U/L (ref 37–153)
BUN: 13 mg/dL (ref 7–25)
CO2: 30 mmol/L (ref 20–32)
Calcium: 10.2 mg/dL (ref 8.6–10.4)
Chloride: 99 mmol/L (ref 98–110)
Creat: 0.75 mg/dL (ref 0.50–0.99)
GFR, Est African American: 100 mL/min/{1.73_m2} (ref >=60)
GFR, Est Non African American: 86 mL/min/{1.73_m2} (ref >=60)
Globulin: 2.6 g/dL (calc) (ref 1.9–3.7)
Glucose, Bld: 129 mg/dL — ABNORMAL HIGH (ref 65–99)
Potassium: 4.8 mmol/L (ref 3.5–5.3)
Sodium: 137 mmol/L (ref 135–146)
Total Bilirubin: 0.4 mg/dL (ref 0.2–1.2)
Total Protein: 7.3 g/dL (ref 6.1–8.1)

## 2019-08-27 LAB — HEMOGLOBIN A1C
Hgb A1c MFr Bld: 6.3 % of total Hgb — ABNORMAL HIGH (ref <5.7)
Mean Plasma Glucose: 134 (calc)
eAG (mmol/L): 7.4 (calc)

## 2019-08-27 LAB — LIPID PANEL
Cholesterol: 122 mg/dL (ref <200)
HDL: 40 mg/dL — ABNORMAL LOW (ref >=50)
LDL Cholesterol (Calc): 59 mg/dL (calc)
Non-HDL Cholesterol (Calc): 82 mg/dL (calc) (ref <130)
Total CHOL/HDL Ratio: 3.1 (calc) (ref <5.0)
Triglycerides: 147 mg/dL (ref <150)

## 2019-08-29 MED ORDER — SEMAGLUTIDE (1 MG/DOSE) 2 MG/1.5ML ~~LOC~~ SOPN: 1.0000 mg | PEN_INJECTOR | SUBCUTANEOUS | 1 refills | Status: DC

## 2019-08-29 NOTE — Addendum Note (Signed)
Addended by: Danelle Berry on: 08/29/2019 05:51 PM   Modules accepted: Orders

## 2019-11-16 ENCOUNTER — Other Ambulatory Visit: Payer: Self-pay | Admitting: Family Medicine

## 2019-11-17 NOTE — Telephone Encounter (Signed)
Requested Prescriptions  Pending Prescriptions Disp Refills  . fluticasone (FLONASE) 50 MCG/ACT nasal spray [Pharmacy Med Name: FLUTICASONE PROP NASAL SPRAY 16GM 50MCG] 48 g 3    Sig: USE 2 SPRAYS IN EACH NOSTRIL DAILY     Ear, Nose, and Throat: Nasal Preparations - Corticosteroids Passed - 11/16/2019  1:29 PM      Passed - Valid encounter within last 12 months    Recent Outpatient Visits          3 months ago Type 2 diabetes mellitus without complication, without long-term current use of insulin [facility])   [facility] [place], Sheliah Mends, PA-C   9 months ago Type 2 diabetes mellitus without complication, without long-term current use of insulin [facility])   [facility] P[place], Percell Belt, NP   11 months ago Herpes zoster without complication   [facility] [place], Lanora Manis E, NP   1 year ago Type 2 diabetes mellitus without complication, without long-term current use of insulin [facility])   [facility] Lada, Janit Bern, MD   1 year ago Type 2 diabetes mellitus without complication, without long-term current use of insulin [facility])   [facility] Lada, Janit Bern, MD

## 2019-12-23 ENCOUNTER — Other Ambulatory Visit: Payer: Self-pay

## 2019-12-23 MED ORDER — ONETOUCH DELICA PLUS LANCET30G MISC: 3 refills | Status: AC

## 2019-12-23 MED ORDER — GLUCOSE BLOOD VI STRP: ORAL_STRIP | 3 refills | Status: AC

## 2020-01-29 ENCOUNTER — Other Ambulatory Visit: Payer: Self-pay | Admitting: Family Medicine

## 2020-01-29 DIAGNOSIS — E119 Type 2 diabetes mellitus without complications: Secondary | ICD-10-CM

## 2020-01-29 DIAGNOSIS — Z5181 Encounter for therapeutic drug level monitoring: Secondary | ICD-10-CM

## 2020-01-29 DIAGNOSIS — Z760 Encounter for issue of repeat prescription: Secondary | ICD-10-CM

## 2020-01-30 NOTE — Telephone Encounter (Signed)
Lvm that pt needs appt before her next refill.

## 2020-02-17 ENCOUNTER — Ambulatory Visit: Payer: Self-pay

## 2020-03-26 ENCOUNTER — Other Ambulatory Visit: Payer: Self-pay | Admitting: Family Medicine

## 2020-03-26 DIAGNOSIS — Z1231 Encounter for screening mammogram for malignant neoplasm of breast: Secondary | ICD-10-CM

## 2020-04-09 ENCOUNTER — Other Ambulatory Visit: Payer: Self-pay

## 2020-04-09 ENCOUNTER — Ambulatory Visit
Admission: RE | Admit: 2020-04-09 | Discharge: 2020-04-09 | Disposition: A | Discharge status: Home / Self Care | Payer: BC Managed Care – PPO | Source: Ambulatory Visit | Attending: Family Medicine | Admitting: Family Medicine

## 2020-04-09 DIAGNOSIS — Z1231 Encounter for screening mammogram for malignant neoplasm of breast: Secondary | ICD-10-CM | POA: Insufficient documentation

## 2020-04-13 ENCOUNTER — Ambulatory Visit
Admission: EM | Admit: 2020-04-13 | Discharge: 2020-04-13 | Disposition: A | Discharge status: Home / Self Care | Payer: BC Managed Care – PPO | Attending: Emergency Medicine | Admitting: Emergency Medicine

## 2020-04-13 DIAGNOSIS — B349 Viral infection, unspecified: Secondary | ICD-10-CM | POA: Diagnosis not present

## 2020-04-13 DIAGNOSIS — R112 Nausea with vomiting, unspecified: Secondary | ICD-10-CM | POA: Diagnosis not present

## 2020-04-13 MED ORDER — ONDANSETRON HCL 4 MG PO TABS: 4.0000 mg | ORAL_TABLET | Freq: Four times a day (QID) | ORAL | 0 refills | Status: AC

## 2020-04-13 MED ORDER — ONDANSETRON 4 MG PO TBDP
4.0000 mg | ORAL_TABLET | Freq: Once | ORAL | Status: AC
  Administered 2020-04-13: 4 mg via ORAL

## 2020-04-13 MED ORDER — ONDANSETRON HCL 4 MG/2ML IJ SOLN: 4.0000 mg | Freq: Once | INTRAMUSCULAR | Status: DC

## 2020-04-13 NOTE — ED Triage Notes (Addendum)
Patient reports emesis x2 this am. Reports she felt nauseous, tried to eat and threw up. Reports this was followed by body aches and headache.   Patient tested positive for covid on 02/13/2020.

## 2020-04-13 NOTE — ED Provider Notes (Signed)
Susan Bates    CSN: [id number] Arrival date & time: 04/13/20  1346      History   Chief Complaint Chief Complaint  Patient presents with  . Generalized Body Aches  . Emesis  . Headache    HPI Susan Bates is a 64 y.o. female.   Patient presents with nausea, body aches, headache, and 2 episodes of emesis this morning.  She denies fever, chills, sore throat, cough, shortness of breath, abdominal pain, diarrhea, or other symptoms.  She was COVID positive on 02/13/2020.  The history is provided by the patient.    Past Medical History:  Diagnosis Date  . Anxiety   . Depression   . Diabetes mellitus without complication (HCC)   . High cholesterol   . Thyroid disease     Patient Active Problem List   Diagnosis Date Noted  . Current moderate episode of major depressive disorder (HCC) 08/05/2019  . Obesity (BMI 30.0-34.9) 01/15/2018  . Abnormal breast finding 05/21/2017  . Cervical arthritis 05/11/2017  . DM type 2 (diabetes mellitus, type 2) (HCC) 10/01/2015  . Post-surgical hypothyroidism 10/01/2015  . Hypercholesteremia 10/01/2015    Past Surgical History:  Procedure Laterality Date  . ABDOMINAL HYSTERECTOMY    . thyriodectomy    . TUBAL LIGATION      OB History   No obstetric history on file.      Home Medications    Prior to Admission medications   Medication Sig Start Date End Date Taking? Authorizing Provider  aspirin 81 MG tablet Take 81 mg by mouth daily.    [provider]  Cholecalciferol (VITAMIN D-3) 1000 units CAPS Take 1 capsule by mouth daily.     [provider]  Flaxseed, Linseed, (FLAXSEED OIL PO) Take 1 tablet by mouth daily.     [provider]  FLUoxetine (PROZAC) 40 MG capsule Take 1 capsule (40 mg total) by mouth daily. 07/29/19   Danelle Berry, PA-C  fluticasone (FLONASE) 50 MCG/ACT nasal spray USE 2 SPRAYS IN [facility] NOSTRIL DAILY 11/17/19   Danelle Berry, PA-C  glucose blood (ONE TOUCH ULTRA TEST) test  strip CHECK FINGERSTICK BLOOD SUGAR DAILY IF DESIRED 12/23/19   Danelle Berry, PA-C  Insulin Pen Needle (PEN NEEDLES) 31G X 6 MM MISC For use with weekly injections, subcutaneously; LON 99 months 04/29/18   Kerman Passey, MD  Lancets (ONETOUCH DELICA PLUS LANCET30G) MISC CHECK FINGERSTICK BLOOD SUGAR DAILY IF DESIRED 12/23/19   Danelle Berry, PA-C  levothyroxine (SYNTHROID) 25 MCG tablet One by mouth three times a week (like Monday, Wednesday, Friday) 10/06/18   Lada, Janit Bern, MD  magnesium 30 MG tablet Take 30 mg by mouth daily.    [provider]  meloxicam (MOBIC) 15 MG tablet Take 1 tablet (15 mg total) by mouth daily. prn 09/29/16   Kerman Passey, MD  metFORMIN (GLUCOPHAGE) 500 MG tablet TAKE 1 TABLET TWICE A DAY WITH MEALS 04/17/19   Danelle Berry, PA-C  Multiple Vitamin (MULTI-VITAMINS) TABS Take by mouth daily.    [provider]  Multiple Vitamins-Minerals (OCUVITE ADULT 50+) CAPS Take by mouth.    [provider]  ondansetron (ZOFRAN) 4 MG tablet Take 1 tablet (4 mg total) by mouth every 6 (six) hours. 04/13/20   Mickie Bail, NP  OZEMPIC, 1 MG/DOSE, 2 MG/1.5ML SOPN INJECT 1 MG UNDER THE SKIN ONCE A WEEK (DOSE INCREASE) 01/29/20   Danelle Berry, PA-C  rosuvastatin (CRESTOR) 20 MG tablet Take 1  tablet (20 mg total) by mouth at bedtime. 07/29/19   Danelle Berry, PA-C  thyroid (ARMOUR THYROID) 60 MG tablet Take 1 tablet (60 mg total) by mouth daily before breakfast. 01/27/19   Poulose, Percell Belt, NP  tizanidine (ZANAFLEX) 2 MG capsule Take 1-2 capsules (2-4 mg total) by mouth 3 (three) times daily as needed for muscle spasms. 07/29/19   Danelle Berry, PA-C  vitamin B-12 (CYANOCOBALAMIN) 1000 MCG tablet Take 1,000 mcg by mouth daily.    [provider]    Family History Family History  Problem Relation Age of Onset  . Stroke Mother   . Hodgkin's lymphoma Father   . Cancer Sister   . Cervical cancer Sister   . Arthritis Maternal Aunt   . Breast cancer Neg Hx      Social History Social History   Tobacco Use  . Smoking status: Never Smoker  . Smokeless tobacco: Never Used  Vaping Use  . Vaping Use: Never used  Substance Use Topics  . Alcohol use: Yes    Comment: occasional  . Drug use: No     Allergies   Fish allergy and Shellfish allergy   Review of Systems Review of Systems  Constitutional: Negative for chills and fever.  HENT: Negative for ear pain and sore throat.   Eyes: Negative for pain and visual disturbance.  Respiratory: Negative for cough and shortness of breath.   Cardiovascular: Negative for chest pain and palpitations.  Gastrointestinal: Positive for nausea and vomiting. Negative for abdominal pain.  Genitourinary: Negative for dysuria and hematuria.  Musculoskeletal: Negative for arthralgias and back pain.  Skin: Negative for color change and rash.  Neurological: Positive for headaches. Negative for seizures and syncope.  All other systems reviewed and are negative.    Physical Exam Triage Vital Signs ED Triage Vitals  Enc Vitals Group     BP      Pulse      Resp      Temp      Temp src      SpO2      Weight      Height      Head Circumference      Peak Flow      Pain Score      Pain Loc      Pain Edu?      Excl. in GC?    No data found.  Updated Vital Signs BP 111/67   Pulse 78   Temp 98.5 F (36.9 C)   Resp 16   SpO2 95%   Visual Acuity Right Eye Distance:   Left Eye Distance:   Bilateral Distance:    Right Eye Near:   Left Eye Near:    Bilateral Near:     Physical Exam Vitals and nursing note reviewed.  Constitutional:      General: She is not in acute distress.    Appearance: She is well-developed.  HENT:     Head: Normocephalic and atraumatic.     Right Ear: Tympanic membrane normal.     Left Ear: Tympanic membrane normal.     Nose: Nose normal.     Mouth/Throat:     Mouth: Mucous membranes are moist.     Pharynx: Oropharynx is clear.  Eyes:     Conjunctiva/sclera:  Conjunctivae normal.  Cardiovascular:     Rate and Rhythm: Normal rate and regular rhythm.     Heart sounds: No murmur heard.   Pulmonary:  Effort: Pulmonary effort is normal. No respiratory distress.     Breath sounds: Normal breath sounds.  Abdominal:     General: Bowel sounds are normal.     Palpations: Abdomen is soft.     Tenderness: There is no abdominal tenderness. There is no right CVA tenderness, left CVA tenderness, guarding or rebound.  Musculoskeletal:     Cervical back: Neck supple.  Skin:    General: Skin is warm and dry.     Findings: No rash.  Neurological:     General: No focal deficit present.     Mental Status: She is alert and oriented to person, place, and time.     Gait: Gait normal.  Psychiatric:        Mood and Affect: Mood normal.        Behavior: Behavior normal.      UC Treatments / Results  Labs (all labs ordered are listed, but only abnormal results are displayed) Labs Reviewed - No data to display  EKG   Radiology No results found.  Procedures Procedures (including critical care time)  Medications Ordered in UC Medications  ondansetron (ZOFRAN-ODT) disintegrating tablet 4 mg (4 mg Oral Given 04/13/20 1420)    Initial Impression / Assessment and Plan / UC Course  I have reviewed the triage vital signs and the nursing notes.  Pertinent labs & imaging results that were available during my care of the patient were reviewed by me and considered in my medical decision making (see chart for details).   Non-intractable vomiting with nausea, viral illness.  Zofran given here and patient able to drink clear liquids without emesis.  Prescription for emesis provided.  Instructed patient to keep yourself hydrated with clear liquids.  Instructed her to go to the ED if she has acute worsening symptoms.  Patient agrees to plan of care.   Final Clinical Impressions(s) / UC Diagnoses   Final diagnoses:  Viral illness  Non-intractable vomiting  with nausea, unspecified vomiting type     Discharge Instructions     Take the antinausea medication as directed.    Keep yourself hydrated with clear liquids, such as water, Gatorade, Pedialyte, Sprite, or ginger ale.    Go to the emergency department if you have acute worsening symptoms.    Follow up with your primary care provider if your symptoms are not improving.         ED Prescriptions    Medication Sig Dispense Auth. Provider   ondansetron (ZOFRAN) 4 MG tablet Take 1 tablet (4 mg total) by mouth every 6 (six) hours. 12 tablet Mickie Bail, NP     PDMP not reviewed this encounter.   Mickie Bail, NP 04/13/20 1451

## 2020-04-13 NOTE — Discharge Instructions (Addendum)
Take the antinausea medication as directed.    Keep yourself hydrated with clear liquids, such as water, Gatorade, Pedialyte, Sprite, or ginger ale.    Go to the emergency department if you have acute worsening symptoms.    Follow up with your primary care provider if your symptoms are not improving.      

## 2020-08-25 ENCOUNTER — Other Ambulatory Visit: Payer: Self-pay | Admitting: Family Medicine

## 2020-08-25 DIAGNOSIS — F321 Major depressive disorder, single episode, moderate: Secondary | ICD-10-CM

## 2020-09-13 ENCOUNTER — Other Ambulatory Visit: Payer: Self-pay | Admitting: Family Medicine

## 2020-09-13 DIAGNOSIS — E7800 Pure hypercholesterolemia, unspecified: Secondary | ICD-10-CM

## 2021-05-19 ENCOUNTER — Other Ambulatory Visit: Payer: Self-pay | Admitting: Family Medicine

## 2021-05-19 DIAGNOSIS — Z1231 Encounter for screening mammogram for malignant neoplasm of breast: Secondary | ICD-10-CM

## 2021-06-09 ENCOUNTER — Other Ambulatory Visit: Payer: Self-pay

## 2021-06-09 ENCOUNTER — Ambulatory Visit
Admission: RE | Admit: 2021-06-09 | Discharge: 2021-06-09 | Disposition: A | Discharge status: Home / Self Care | Payer: BC Managed Care – PPO | Source: Ambulatory Visit | Attending: Family Medicine | Admitting: Family Medicine

## 2021-06-09 DIAGNOSIS — Z1231 Encounter for screening mammogram for malignant neoplasm of breast: Secondary | ICD-10-CM | POA: Diagnosis not present

## 2022-06-19 ENCOUNTER — Other Ambulatory Visit: Payer: Self-pay

## 2022-06-19 ENCOUNTER — Ambulatory Visit
Admission: RE | Admit: 2022-06-19 | Discharge: 2022-06-19 | Disposition: A | Discharge status: Home / Self Care | Payer: BC Managed Care – PPO | Source: Ambulatory Visit | Attending: Family Medicine | Admitting: Family Medicine

## 2022-06-19 DIAGNOSIS — Z1231 Encounter for screening mammogram for malignant neoplasm of breast: Secondary | ICD-10-CM

## 2023-06-05 ENCOUNTER — Other Ambulatory Visit: Payer: Self-pay | Admitting: Family Medicine

## 2023-06-05 DIAGNOSIS — Z1231 Encounter for screening mammogram for malignant neoplasm of breast: Secondary | ICD-10-CM

## 2023-06-21 ENCOUNTER — Other Ambulatory Visit: Payer: Self-pay | Admitting: Family Medicine

## 2023-06-21 ENCOUNTER — Ambulatory Visit
Admission: RE | Admit: 2023-06-21 | Discharge: 2023-06-21 | Disposition: A | Discharge status: Home / Self Care | Payer: BC Managed Care – PPO | Source: Ambulatory Visit | Attending: Family Medicine | Admitting: Family Medicine

## 2023-06-21 DIAGNOSIS — Z1231 Encounter for screening mammogram for malignant neoplasm of breast: Secondary | ICD-10-CM | POA: Diagnosis present

## 2023-06-21 DIAGNOSIS — Z780 Asymptomatic menopausal state: Secondary | ICD-10-CM

## 2023-07-11 IMAGING — MG MM DIGITAL SCREENING BILAT W/ TOMO AND CAD
8 series · 9 of 24 positions shown · non-contrast
Comparison: Previous exam(s).

CLINICAL DATA: Screening.

EXAM:
DIGITAL SCREENING BILATERAL MAMMOGRAM WITH TOMOSYNTHESIS AND CAD
TECHNIQUE: Bilateral screening digital craniocaudal and mediolateral oblique
mammograms were obtained. Bilateral screening digital breast
tomosynthesis was performed. The images were evaluated with
computer-aided detection.

[R MLO synth-2D]
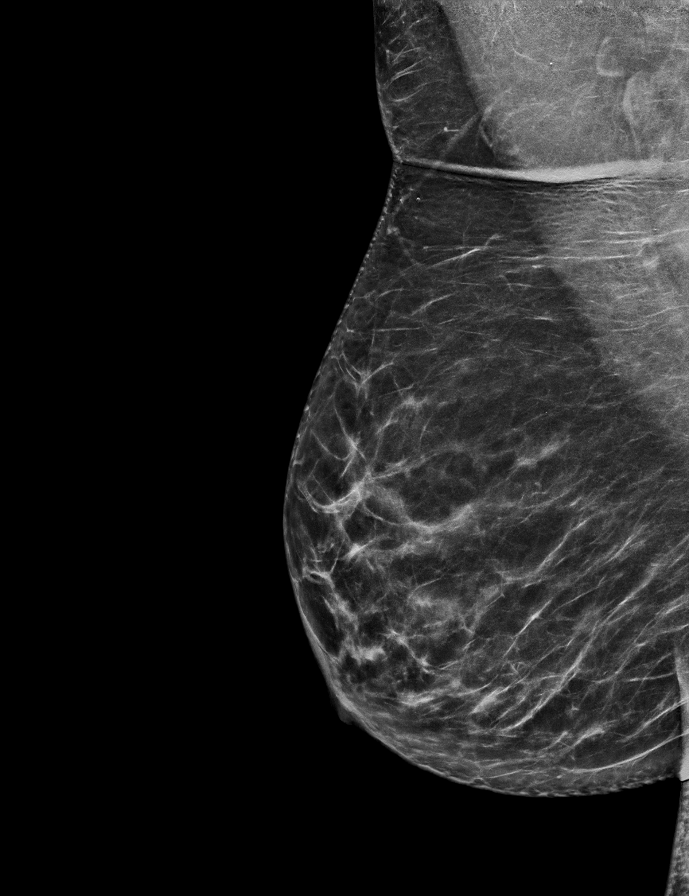

[L MLO synth-2D]
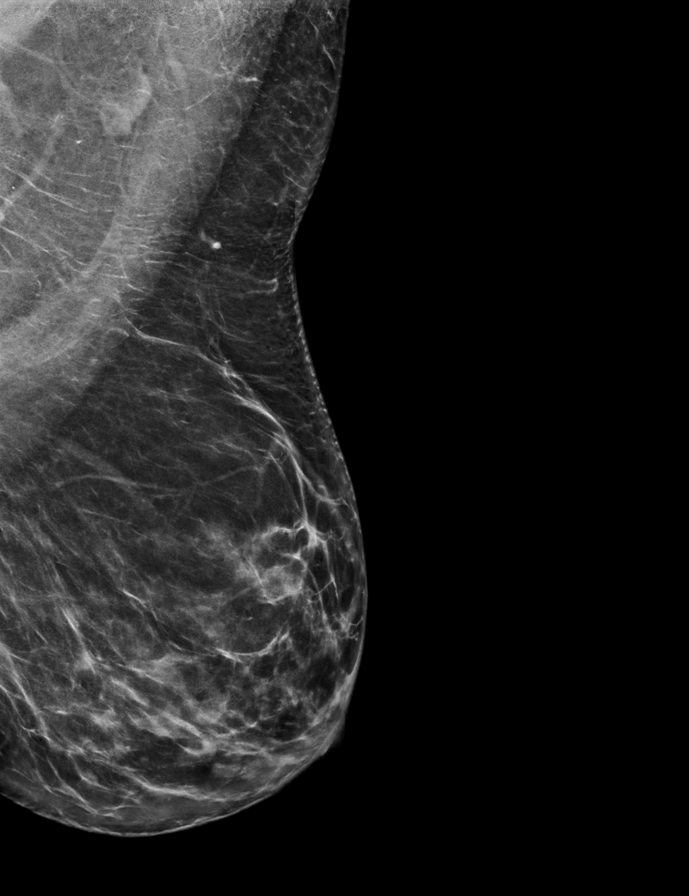

[R CC synth-2D]
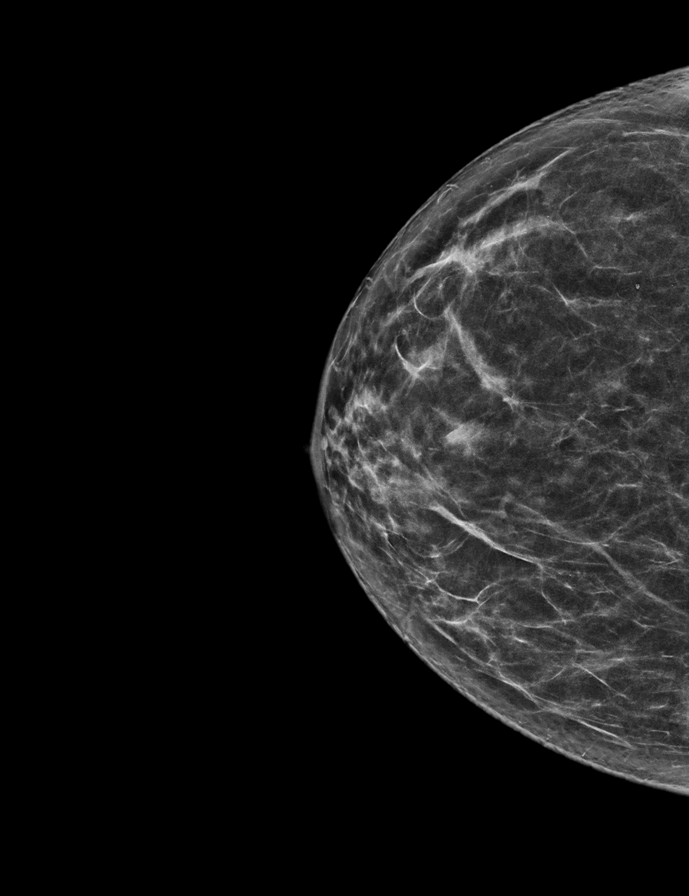

[L CC synth-2D]
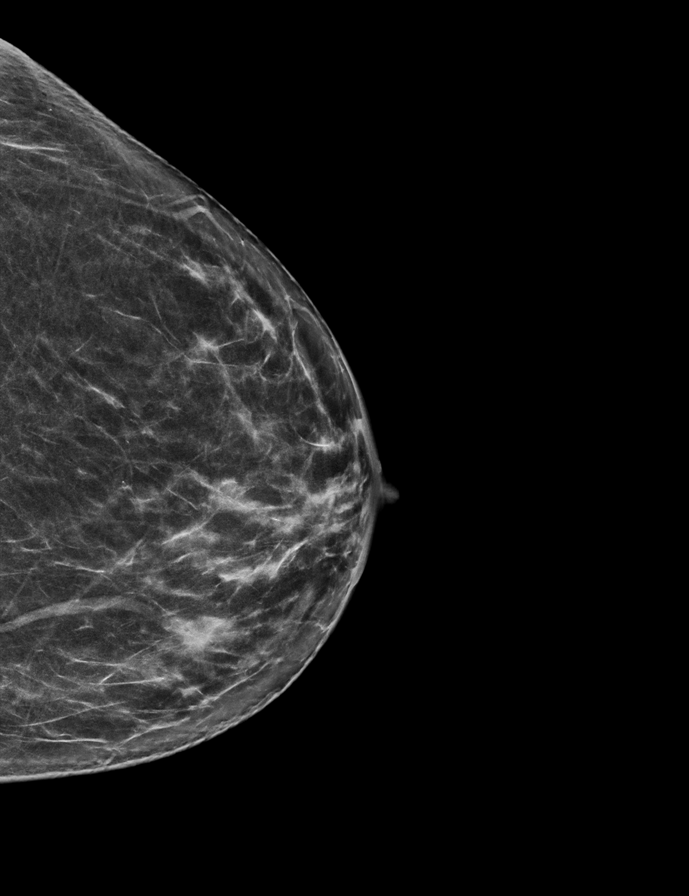

[L CC tomo · 2 of 63 frames shown]
[frame 21/63]
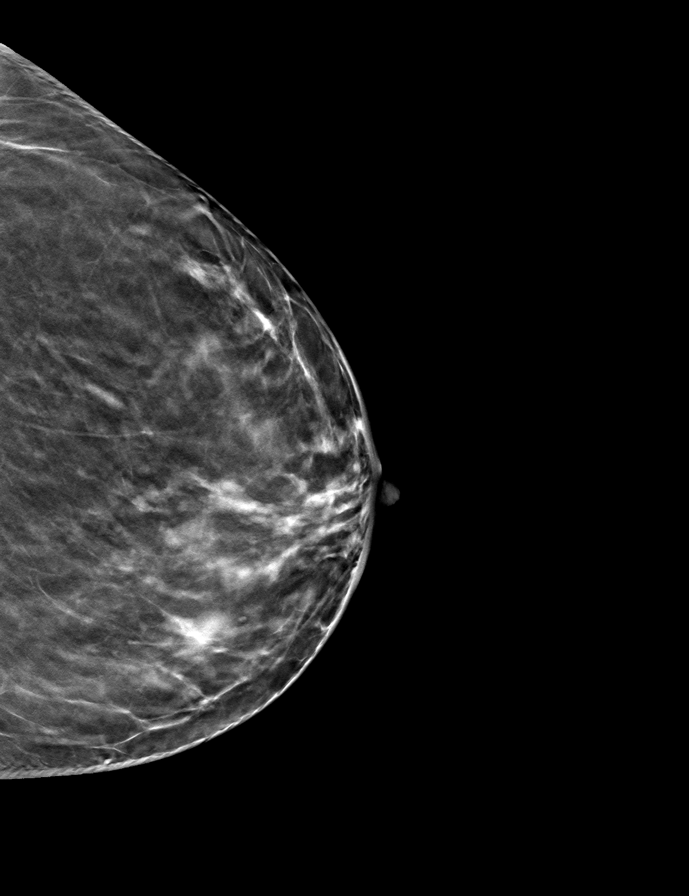
[frame 32/63]
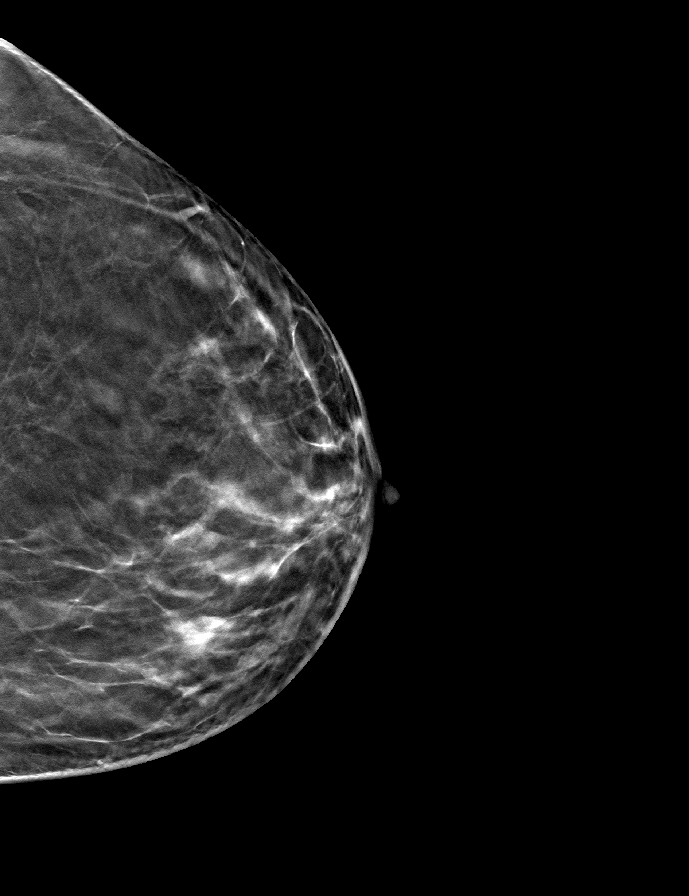

[L MLO tomo · tomo slice 37/72.0]
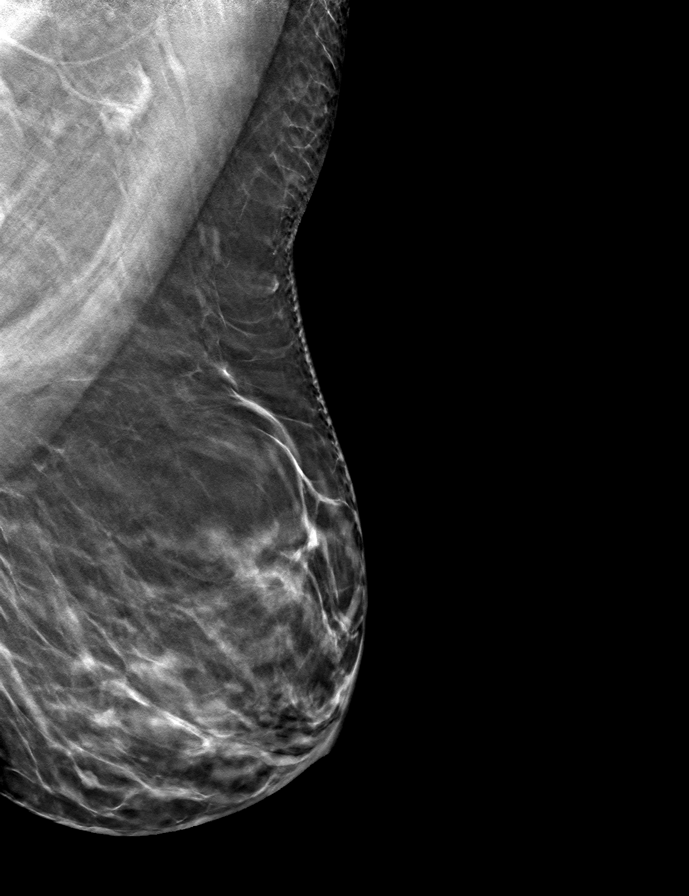

[R CC tomo · tomo slice 31/62.0]
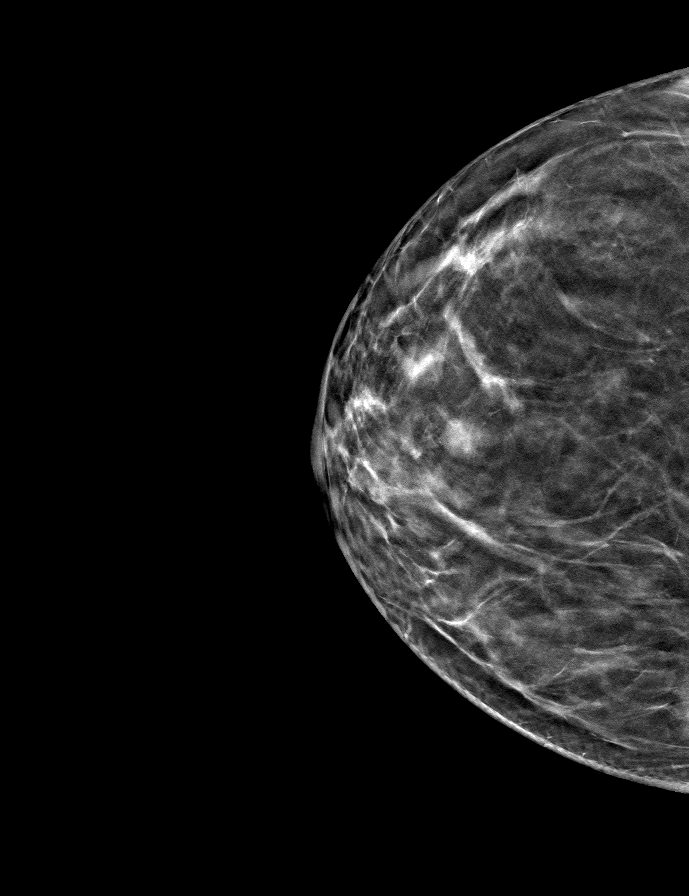

[R MLO tomo · tomo slice 36/71.0]
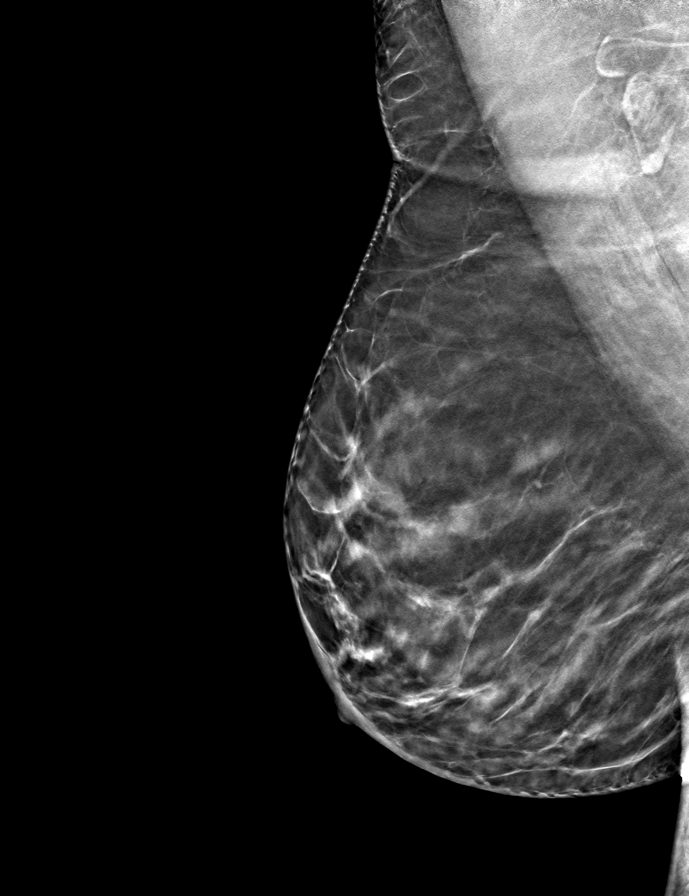

[9 of 24 positions shown; findings below may reference images not displayed]

ACR Breast Density Category b: There are scattered areas of
fibroglandular density.
FINDINGS: There are no findings suspicious for malignancy.
IMPRESSION: No mammographic evidence of malignancy. A result letter of this
screening mammogram will be mailed directly to the patient.

RECOMMENDATION:
Screening mammogram in one year. (Code:51-O-LD2)

BI-RADS CATEGORY  1: Negative.
